# Patient Record
Sex: Female | Born: 1995 | Race: White | Hispanic: No | Marital: Single | Smoking: Current some day smoker
Health system: Southern US, Community
[De-identification: ages and names within clinical notes are randomized; demographics above are authoritative.]

## PROBLEM LIST (undated history)

## (undated) DIAGNOSIS — N189 Chronic kidney disease, unspecified: Secondary | ICD-10-CM

## (undated) DIAGNOSIS — F329 Major depressive disorder, single episode, unspecified: Secondary | ICD-10-CM

## (undated) DIAGNOSIS — K219 Gastro-esophageal reflux disease without esophagitis: Secondary | ICD-10-CM

## (undated) DIAGNOSIS — F32A Depression, unspecified: Secondary | ICD-10-CM

## (undated) DIAGNOSIS — F419 Anxiety disorder, unspecified: Secondary | ICD-10-CM

---
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
## 2014-04-03 DIAGNOSIS — F1010 Alcohol abuse, uncomplicated: Secondary | ICD-10-CM | POA: Insufficient documentation

## 2014-04-03 DIAGNOSIS — Z7289 Other problems related to lifestyle: Secondary | ICD-10-CM | POA: Insufficient documentation

## 2014-04-03 DIAGNOSIS — F1210 Cannabis abuse, uncomplicated: Secondary | ICD-10-CM | POA: Insufficient documentation

## 2014-04-03 DIAGNOSIS — F32A Depression, unspecified: Secondary | ICD-10-CM | POA: Insufficient documentation

## 2014-04-03 DIAGNOSIS — T391X2A Poisoning by 4-Aminophenol derivatives, intentional self-harm, initial encounter: Secondary | ICD-10-CM | POA: Insufficient documentation

## 2014-04-03 DIAGNOSIS — F329 Major depressive disorder, single episode, unspecified: Secondary | ICD-10-CM | POA: Insufficient documentation

## 2014-04-03 DIAGNOSIS — F1320 Sedative, hypnotic or anxiolytic dependence, uncomplicated: Secondary | ICD-10-CM | POA: Insufficient documentation

## 2014-04-03 DIAGNOSIS — F1410 Cocaine abuse, uncomplicated: Secondary | ICD-10-CM | POA: Insufficient documentation

## 2014-04-03 DIAGNOSIS — F6089 Other specific personality disorders: Secondary | ICD-10-CM | POA: Insufficient documentation

## 2014-04-03 DIAGNOSIS — F1310 Sedative, hypnotic or anxiolytic abuse, uncomplicated: Secondary | ICD-10-CM | POA: Insufficient documentation

## 2014-05-05 ENCOUNTER — Emergency Department: Payer: Self-pay | Admitting: Emergency Medicine

## 2015-01-12 NOTE — Discharge Summary (Signed)
PATIENT NAME:  Heather Duncan, Heather Duncan MR#:  [id number] DATE OF BIRTH:  11-17-1995  DATE OF ADMISSION:  11/16/2013 DATE OF DISCHARGE:  11/21/2013  HOSPITAL COURSE: See dictated history and physical for details of admission. An 19 year old woman with a history of depression came into the hospital with serious suicidal ideation. She had overdosed on Tylenol. Fortunately, her blood level did not rise into the severely dangerous level and she did not require full detoxification and did not appear to have done any lasting damage to her liver. The patient gave a history of having been depressed for quite a while with a recent worsening while at school at [employer]. She did not report specific psychotic symptoms, but at times appeared to be quite withdrawn and a little bit disorganized in her thinking. She was cooperative with treatment; however, and did not engage in any dangerous behavior on the unit. She remained somewhat withdrawn at times, but showed improved insight. Met with her family and that appeared to be going well. We counseled the patient that she consider taking at least this semester off from college to recover from this and the patient appeared to agree with this assessment. I gave her a preliminary note suggesting that. She is going to have follow-up care with Dr. Geanie Kenning at [facility] in [place]. She was treated on the ward with fluoxetine 20 mg a day, also low-dose zolpidem and trazodone to assist with sleep. Did not ever show any clear obvious symptoms of psychosis. She was counseled about the importance of following up with treatment and of monitoring symptoms and advising family and care providers of any worsening and agreed to all of this.   DISPOSITION: Discharge home with her family who live locally. Follow up with [facility].   MEDICATIONS: Fluoxetine 20 mg per day, trazodone 50 mg at night as needed for sleep, zolpidem 5 mg at night as needed for sleep.    MENTAL STATUS EXAM AT DISCHARGE: Neatly groomed and dressed woman, looks her stated age, cooperative with the interview. Eye contact good. Psychomotor activity a little bit sluggish still, speech a little bit slow and decreased in amount. Affect blunted but improved from admission. Mood stated as being okay. Thoughts are lucid but a little bit slow. No obvious delusions. No loosening of associations. Denies suicidal or homicidal ideation. Denies any hallucinations. Short and long-term memory both grossly intact. Good fund of knowledge.   LABORATORY RESULTS: Admission labs included urinalysis unremarkable. Acetaminophen negative off. at the time she was admitted on the 28th, sodium elevated at 142, calcium low 8.9. Alcohol negative. CBC normal. TSH normal. Drug screen positive for cocaine and cannabis. Urine pregnancy test negative. EKG normal.   DIAGNOSIS, PRINCIPAL AND PRIMARY:  AXIS I: Major depression, severe, single.   SECONDARY DIAGNOSES: AXIS I: Marijuana and cocaine abuse.  AXIS II: Deferred.  AXIS III: No diagnosis.  AXIS IV: Severe from having to leave school from degree of illness.  AXIS V: Functioning at time of discharge is  55.   ________________________________  Gonzella Lex, MD jtc:sg D: 12/10/2013 23:24:39 ET T: 12/11/2013 10:07:36 ET JOB#: [id number]  cc: Gonzella Lex, MD, <Dictator> Gonzella Lex MD ELECTRONICALLY SIGNED 12/11/2013 12:16

## 2015-01-12 NOTE — H&P (Signed)
PATIENT NAME:  Heather Duncan, Heather Duncan MR#:  [id number] DATE OF BIRTH:  26-Jan-1996  DATE OF ADMISSION:  11/15/2013  IDENTIFYING INFORMATION AND CHIEF COMPLAINT: An 19 year old woman brought to the hospital by her family because of a suicide attempt and depression.   CHIEF COMPLAINT: "I took a lot of Tylenol."   HISTORY OF PRESENT ILLNESS: Information obtained from the patient and the chart. The patient states that last night she was [facility], and she had been drinking. She cannot even estimate how much she had to drink. She says she consumed between 10 and 12 Tylenol tablets, and that at the time she did it, she was actually thinking that she wanted to kill herself. She went to bed without telling anyone, but the next morning woke up and told some friends, and called her mother and told her mother that she wanted to come home from school. A friend made her inform her mother about the overdose as well, and mother brought her here to the hospital. The patient says that she has been feeling depressed for a couple of years. The suicidal ideation has been present for many months. She says she sleeps well at night. She is doing okay in school. She says that she does not feel like she has many friends at [employer]. She is rather vague about describing her stresses, just says that she feels depressed much of the time. She admits that she drinks frequently, 3 or 4 nights a week, but does not see it as being a problem. She also uses marijuana fairly regularly, and has used cocaine several times in the last couple weeks, but minimizes the degree to which that is a problem. She is not currently seeing anyone for any kind of mental health treatment. She does not describe, right now, any specific acute trauma.   PAST PSYCHIATRIC HISTORY: No previous psychiatric hospitalizations. No history of psychiatric medicine. She saw a therapist when she was in high school, but did not think that it was helpful.   SUBSTANCE  ABUSE HISTORY: Says that she drinks a few times a week, but does not think it is a problem. Does not report a history of DTs or seizures. Uses marijuana occasionally, maybe a couple of times a week, and has used cocaine a few times recently, but minimizes the degree to which that is a problem.   FAMILY HISTORY: Positive family history of depression in several members of her family, including a grandmother and several aunts. No known history of suicide attempts.   MEDICAL HISTORY: No significant known ongoing medical problems.   CURRENT MEDICATIONS: Takes Loestrin birth control pills, but denied any other prescribed medicine.   ALLERGIES: No known drug allergies.   REVIEW OF SYSTEMS: Depressed mood. Feels sad a lot of the time. Frequent suicidal ideation. Does not report any psychotic symptoms. Currently not reporting any other significant medical complaints. The rest of the review of systems is negative.   MENTAL STATUS EXAMINATION: An adequately groomed woman who looks her stated age. Passively cooperative with the interview. Eye contact intermittent. Psychomotor activity a little bit slow. Speech is a little bit slow and decreased in total amount. Her thoughts seemed to be a little bit scattered, although it is possible that it could be from anxiety. She did not make any grossly bizarre statements, but does not elaborate answers very well. Denies hallucinations. Had recent suicidal ideation. No homicidal ideation. Judgment and insight appear to have some impairment. Intelligence appears to  be normal, acute fund of knowledge normal, short and long-term memory appear to be grossly intact.   PHYSICAL EXAMINATION: SKIN: The patient has multiple superficial scratches or cuts up and down her left arm. She says that is something she has done intermittently, but had stopped for a long time until here recently. No other acute skin lesions.  HEENT: Pupils equal and reactive. Face symmetric. Oral mucosa  normal.  NECK AND BACK:  Flexible, nontender.  MUSCULOSKELETAL: Full range of motion at extremities. Normal gait.  NEUROLOGIC: Strength and reflexes symmetric and normal throughout. Cranial nerves symmetric and normal.  LUNGS: Clear to auscultation.  HEART: Regular rate and rhythm.  ABDOMEN: Soft, nontender. Normal bowel sounds.   VITAL SIGNS: Currently temperature 98.1, pulse 111, respirations 20, blood pressure 133/79.   LABORATORY RESULTS: Drug screen positive for cocaine and cannabis. TSH normal at 0.9. Alcohol level negative. Sodium is slightly elevated at 142, chloride elevated at 110, both possibly from dehydration. Calcium low at 8.9. No other abnormalities. CBC is all normal. Urinalysis positive for protein, no bacteria, no leukocyte esterase. Tylenol level actually came back as zero, salicylates also zero. Pregnancy test negative.   ASSESSMENT: An 19 year old woman who made an impulsive suicide attempt, but also reports long-standing suicidal ideation and depression. Currently seems to be somewhat slow and reserved in her history. Could all be anxiety or the depression. The patient needs hospitalization because of acute suicidality and ongoing depressive symptoms.   TREATMENT PLAN: Admit to Psychiatry. I have proposed to her that we start fluoxetine for treatment of her depression. She is agreeable to this, 10 mg per day. Engage her in groups and activities on the unit as appropriate, and do daily re-evaluation. Her mother has already called up here, and I asked the patient's permission to speak to her mother. She gave permission, except for anything regarding substance abuse, which is her right.   DIAGNOSIS, PRINCIPAL AND PRIMARY:  AXIS I: Major depression, severe, single episode.   SECONDARY DIAGNOSES: AXIS I: Cannabis, cocaine, and alcohol abuse.  AXIS II: Deferred.  AXIS III: Status post acetaminophen overdose, but with no evident sequelae or need for specific treatment.  AXIS IV:  Moderate stress from college experience, new time away from home.  AXIS V: Functioning at time of evaluation 30.      ____________________________ Audery Amel, MD jtc:mr D: 11/15/2013 18:53:39 ET T: 11/15/2013 19:04:52 ET JOB#: [id number]  cc: Audery Amel, MD, <Dictator> Audery Amel MD ELECTRONICALLY SIGNED 11/16/2013 0:44

## 2015-03-03 ENCOUNTER — Encounter: Payer: Self-pay | Admitting: *Deleted

## 2015-03-03 ENCOUNTER — Observation Stay
Admission: EM | Admit: 2015-03-03 | Discharge: 2015-03-04 | Disposition: A | Discharge status: Home / Self Care | Payer: BLUE CROSS/BLUE SHIELD | Attending: Internal Medicine | Admitting: Internal Medicine

## 2015-03-03 ENCOUNTER — Emergency Department: Payer: BLUE CROSS/BLUE SHIELD

## 2015-03-03 DIAGNOSIS — F329 Major depressive disorder, single episode, unspecified: Secondary | ICD-10-CM | POA: Diagnosis not present

## 2015-03-03 DIAGNOSIS — N8320 Unspecified ovarian cysts: Secondary | ICD-10-CM | POA: Diagnosis not present

## 2015-03-03 DIAGNOSIS — F17210 Nicotine dependence, cigarettes, uncomplicated: Secondary | ICD-10-CM | POA: Diagnosis not present

## 2015-03-03 DIAGNOSIS — D72829 Elevated white blood cell count, unspecified: Secondary | ICD-10-CM | POA: Insufficient documentation

## 2015-03-03 DIAGNOSIS — Z79899 Other long term (current) drug therapy: Secondary | ICD-10-CM | POA: Diagnosis not present

## 2015-03-03 DIAGNOSIS — R197 Diarrhea, unspecified: Secondary | ICD-10-CM

## 2015-03-03 DIAGNOSIS — R1110 Vomiting, unspecified: Secondary | ICD-10-CM

## 2015-03-03 DIAGNOSIS — F1210 Cannabis abuse, uncomplicated: Secondary | ICD-10-CM

## 2015-03-03 DIAGNOSIS — K529 Noninfective gastroenteritis and colitis, unspecified: Secondary | ICD-10-CM | POA: Diagnosis not present

## 2015-03-03 DIAGNOSIS — R1013 Epigastric pain: Secondary | ICD-10-CM

## 2015-03-03 DIAGNOSIS — E876 Hypokalemia: Secondary | ICD-10-CM

## 2015-03-03 HISTORY — DX: Depression, unspecified: F32.A

## 2015-03-03 HISTORY — DX: Major depressive disorder, single episode, unspecified: F32.9

## 2015-03-03 LAB — URINALYSIS COMPLETE WITH MICROSCOPIC (ARMC ONLY)
BILIRUBIN URINE: NEGATIVE
Bacteria, UA: NONE SEEN
GLUCOSE, UA: 150 mg/dL — AB
LEUKOCYTES UA: NEGATIVE
Nitrite: NEGATIVE
PH: 9 — AB (ref 5.0–8.0)
Protein, ur: NEGATIVE mg/dL
Specific Gravity, Urine: 1.017 (ref 1.005–1.030)

## 2015-03-03 LAB — CBC WITH DIFFERENTIAL/PLATELET
Basophils Absolute: 0.1 10*3/uL (ref 0–0.1)
Basophils Relative: 0 %
Eosinophils Absolute: 0.1 10*3/uL (ref 0–0.7)
Eosinophils Relative: 0 %
HEMATOCRIT: 35.6 % (ref 35.0–47.0)
HEMOGLOBIN: 11.7 g/dL — AB (ref 12.0–16.0)
Lymphocytes Relative: 13 %
Lymphs Abs: 2.2 10*3/uL (ref 1.0–3.6)
MCH: 28.9 pg (ref 26.0–34.0)
MCHC: 32.9 g/dL (ref 32.0–36.0)
MCV: 87.8 fL (ref 80.0–100.0)
MONO ABS: 0.9 10*3/uL (ref 0.2–0.9)
Monocytes Relative: 5 %
NEUTROS ABS: 14.4 10*3/uL — AB (ref 1.4–6.5)
Neutrophils Relative %: 82 %
Platelets: 289 10*3/uL (ref 150–440)
RBC: 4.05 MIL/uL (ref 3.80–5.20)
RDW: 13.3 % (ref 11.5–14.5)
WBC: 17.7 10*3/uL — ABNORMAL HIGH (ref 3.6–11.0)

## 2015-03-03 LAB — URINE DRUG SCREEN, QUALITATIVE (ARMC ONLY)
Amphetamines, Ur Screen: NOT DETECTED
BARBITURATES, UR SCREEN: NOT DETECTED
Benzodiazepine, Ur Scrn: NOT DETECTED
CANNABINOID 50 NG, UR ~~LOC~~: POSITIVE — AB
Cocaine Metabolite,Ur ~~LOC~~: NOT DETECTED
MDMA (Ecstasy)Ur Screen: NOT DETECTED
METHADONE SCREEN, URINE: NOT DETECTED
Opiate, Ur Screen: POSITIVE — AB
Phencyclidine (PCP) Ur S: NOT DETECTED
Tricyclic, Ur Screen: NOT DETECTED

## 2015-03-03 LAB — COMPREHENSIVE METABOLIC PANEL
ALBUMIN: 4 g/dL (ref 3.5–5.0)
ALT: 17 U/L (ref 14–54)
ANION GAP: 12 (ref 5–15)
AST: 35 U/L (ref 15–41)
Alkaline Phosphatase: 69 U/L (ref 38–126)
BILIRUBIN TOTAL: 0.5 mg/dL (ref 0.3–1.2)
BUN: 9 mg/dL (ref 6–20)
CALCIUM: 8.8 mg/dL — AB (ref 8.9–10.3)
CO2: 18 mmol/L — ABNORMAL LOW (ref 22–32)
Chloride: 109 mmol/L (ref 101–111)
Creatinine, Ser: 0.9 mg/dL (ref 0.44–1.00)
GFR calc non Af Amer: >60 mL/min (ref >60)
GLUCOSE: 196 mg/dL — AB (ref 65–99)
Potassium: 2.9 mmol/L — CL (ref 3.5–5.1)
Sodium: 139 mmol/L (ref 135–145)
TOTAL PROTEIN: 6.9 g/dL (ref 6.5–8.1)

## 2015-03-03 LAB — PREGNANCY, URINE: Preg Test, Ur: NEGATIVE

## 2015-03-03 LAB — MAGNESIUM: MAGNESIUM: 1.5 mg/dL — AB (ref 1.7–2.4)

## 2015-03-03 LAB — LIPASE, BLOOD: Lipase: 27 U/L (ref 22–51)

## 2015-03-03 MED ORDER — METOCLOPRAMIDE HCL 5 MG/ML IJ SOLN
5.0000 mg | Freq: Once | INTRAMUSCULAR | Status: AC
  Administered 2015-03-03: 5 mg via INTRAVENOUS

## 2015-03-03 MED ORDER — ONDANSETRON HCL 4 MG PO TABS: 4.0000 mg | ORAL_TABLET | Freq: Every day | ORAL | Status: DC | PRN

## 2015-03-03 MED ORDER — DIAZEPAM 5 MG/ML IJ SOLN
1.0000 mg | Freq: Once | INTRAMUSCULAR | Status: AC
  Administered 2015-03-03: 1 mg via INTRAVENOUS

## 2015-03-03 MED ORDER — SODIUM CHLORIDE 0.45 % IV SOLN
Freq: Once | INTRAVENOUS | Status: AC
  Administered 2015-03-03: 05:00:00 via INTRAVENOUS
  Filled 2015-03-03: qty 20

## 2015-03-03 MED ORDER — IOHEXOL 300 MG/ML  SOLN
100.0000 mL | Freq: Once | INTRAMUSCULAR | Status: AC | PRN
  Administered 2015-03-03: 100 mL via INTRAVENOUS

## 2015-03-03 MED ORDER — HYDROMORPHONE HCL 1 MG/ML IJ SOLN
INTRAMUSCULAR | Status: AC
  Administered 2015-03-03: 0.5 mg via INTRAVENOUS
  Filled 2015-03-03: qty 1

## 2015-03-03 MED ORDER — DIAZEPAM 5 MG/ML IJ SOLN: 1.0000 mg | Freq: Once | INTRAMUSCULAR | Status: DC

## 2015-03-03 MED ORDER — POTASSIUM CHLORIDE IN NACL 40-0.9 MEQ/L-% IV SOLN
INTRAVENOUS | Status: DC
  Administered 2015-03-03 – 2015-03-04 (×2): 100 mL/h via INTRAVENOUS
  Filled 2015-03-03 (×6): qty 1000

## 2015-03-03 MED ORDER — MORPHINE SULFATE 4 MG/ML IJ SOLN
4.0000 mg | Freq: Once | INTRAMUSCULAR | Status: AC
  Administered 2015-03-03: 4 mg via INTRAVENOUS

## 2015-03-03 MED ORDER — PROMETHAZINE HCL 25 MG/ML IJ SOLN
25.0000 mg | Freq: Three times a day (TID) | INTRAMUSCULAR | Status: DC | PRN
  Administered 2015-03-03: 25 mg via INTRAVENOUS

## 2015-03-03 MED ORDER — HEPARIN SODIUM (PORCINE) 5000 UNIT/ML IJ SOLN
5000.0000 [IU] | Freq: Three times a day (TID) | INTRAMUSCULAR | Status: DC
  Administered 2015-03-03 – 2015-03-04 (×2): 5000 [IU] via SUBCUTANEOUS
  Filled 2015-03-03 (×2): qty 1

## 2015-03-03 MED ORDER — SODIUM CHLORIDE 0.9 % IV BOLUS (SEPSIS)
1000.0000 mL | Freq: Once | INTRAVENOUS | Status: AC
  Administered 2015-03-03: 1000 mL via INTRAVENOUS

## 2015-03-03 MED ORDER — DIAZEPAM 5 MG/ML IJ SOLN
INTRAMUSCULAR | Status: AC
  Administered 2015-03-03: 1 mg via INTRAVENOUS
  Filled 2015-03-03: qty 2

## 2015-03-03 MED ORDER — IOHEXOL 240 MG/ML SOLN
25.0000 mL | Freq: Once | INTRAMUSCULAR | Status: AC | PRN
  Administered 2015-03-03: 25 mL via ORAL

## 2015-03-03 MED ORDER — METOCLOPRAMIDE HCL 5 MG/ML IJ SOLN
INTRAMUSCULAR | Status: AC
  Administered 2015-03-03: 5 mg via INTRAVENOUS
  Filled 2015-03-03: qty 2

## 2015-03-03 MED ORDER — OXYCODONE HCL 5 MG PO TABS
5.0000 mg | ORAL_TABLET | ORAL | Status: DC | PRN
  Administered 2015-03-04: 5 mg via ORAL
  Filled 2015-03-03: qty 1

## 2015-03-03 MED ORDER — POTASSIUM CHLORIDE IN NACL 40-0.9 MEQ/L-% IV SOLN
INTRAVENOUS | Status: AC
  Administered 2015-03-03: 100 mL/h via INTRAVENOUS
  Filled 2015-03-03: qty 1000

## 2015-03-03 MED ORDER — MORPHINE SULFATE 2 MG/ML IJ SOLN
INTRAMUSCULAR | Status: AC
  Administered 2015-03-03: 1 mg via INTRAVENOUS
  Filled 2015-03-03: qty 1

## 2015-03-03 MED ORDER — MORPHINE SULFATE 4 MG/ML IJ SOLN
INTRAMUSCULAR | Status: AC
Start: 2015-03-03 — End: 2015-03-03
  Administered 2015-03-03: 4 mg via INTRAVENOUS
  Filled 2015-03-03: qty 1

## 2015-03-03 MED ORDER — ONDANSETRON HCL 4 MG/2ML IJ SOLN
INTRAMUSCULAR | Status: AC
  Administered 2015-03-03: 4 mg via INTRAVENOUS
  Filled 2015-03-03: qty 2

## 2015-03-03 MED ORDER — PROMETHAZINE HCL 25 MG/ML IJ SOLN
12.5000 mg | Freq: Once | INTRAMUSCULAR | Status: AC
  Administered 2015-03-03: 12.5 mg via INTRAVENOUS

## 2015-03-03 MED ORDER — MORPHINE SULFATE 2 MG/ML IJ SOLN
1.0000 mg | Freq: Four times a day (QID) | INTRAMUSCULAR | Status: DC | PRN
  Administered 2015-03-03 (×2): 1 mg via INTRAVENOUS
  Filled 2015-03-03: qty 1

## 2015-03-03 MED ORDER — ONDANSETRON HCL 4 MG/2ML IJ SOLN
4.0000 mg | Freq: Four times a day (QID) | INTRAMUSCULAR | Status: DC | PRN
  Administered 2015-03-03: 4 mg via INTRAVENOUS
  Filled 2015-03-03: qty 2

## 2015-03-03 MED ORDER — DULOXETINE HCL 60 MG PO CPEP
60.0000 mg | ORAL_CAPSULE | Freq: Every day | ORAL | Status: DC
  Administered 2015-03-03 – 2015-03-04 (×2): 60 mg via ORAL
  Filled 2015-03-03 (×2): qty 1

## 2015-03-03 MED ORDER — HYDROMORPHONE HCL 1 MG/ML IJ SOLN
0.5000 mg | Freq: Once | INTRAMUSCULAR | Status: AC
  Administered 2015-03-03: 0.5 mg via INTRAVENOUS

## 2015-03-03 MED ORDER — PROMETHAZINE HCL 25 MG/ML IJ SOLN
INTRAMUSCULAR | Status: AC
  Administered 2015-03-03: 12.5 mg via INTRAVENOUS
  Filled 2015-03-03: qty 1

## 2015-03-03 MED ORDER — PROMETHAZINE HCL 25 MG/ML IJ SOLN
INTRAMUSCULAR | Status: AC
  Administered 2015-03-03: 25 mg via INTRAVENOUS
  Filled 2015-03-03: qty 1

## 2015-03-03 MED ORDER — ACETAMINOPHEN 650 MG RE SUPP: 650.0000 mg | Freq: Four times a day (QID) | RECTAL | Status: DC | PRN

## 2015-03-03 MED ORDER — ONDANSETRON HCL 4 MG/2ML IJ SOLN
4.0000 mg | Freq: Once | INTRAMUSCULAR | Status: AC
  Administered 2015-03-03: 4 mg via INTRAVENOUS

## 2015-03-03 MED ORDER — ACETAMINOPHEN 325 MG PO TABS: 650.0000 mg | ORAL_TABLET | Freq: Four times a day (QID) | ORAL | Status: DC | PRN

## 2015-03-03 MED ORDER — ONDANSETRON HCL 4 MG PO TABS: 4.0000 mg | ORAL_TABLET | Freq: Four times a day (QID) | ORAL | Status: DC | PRN

## 2015-03-03 NOTE — ED Provider Notes (Signed)
[facility] Emergency Department Provider Note  ____________________________________________  Time seen: Approximately 3:48 AM  I have reviewed the triage vital signs and the nursing notes.   HISTORY  Chief Complaint Abdominal Pain    HPI Heather Duncan is a 19 y.o. female who presents with epigastric abdominal pain, onset approximately 10 PM after eating [employer] and smoking marijuana. Patient complains of 10/10 nonradiating pain associated with nausea, vomiting and diarrhea.Denies fever, chills, chest pain, shortness of breath, headache, weakness, numbness, tingling. Denies recent travel history. Nothing makes the pain better or worse.   Past Medical History  Diagnosis Date  . Depression     There are no active problems to display for this patient.   History reviewed. No pertinent past surgical history.  Current Outpatient Rx  Name  Route  Sig  Dispense  Refill  . DULoxetine (CYMBALTA) 60 MG capsule   Oral   Take 60 mg by mouth daily.           Allergies Review of patient's allergies indicates no known allergies.  History reviewed. No pertinent family history.  Social History History  Substance Use Topics  . Smoking status: Current Some Day Smoker    Types: Cigarettes  . Smokeless tobacco: Never Used  . Alcohol Use: Yes     Comment: occasionally    Review of Systems Constitutional: No fever/chills Eyes: No visual changes. ENT: No sore throat. Cardiovascular: Denies chest pain. Respiratory: Denies shortness of breath. Gastrointestinal: Positive for abdominal pain, vomiting and diarrhea. Genitourinary: Negative for dysuria. Musculoskeletal: Negative for back pain. Skin: Negative for rash. Neurological: Negative for headaches, focal weakness or numbness.  10-point ROS otherwise negative.  ____________________________________________   PHYSICAL EXAM:  VITAL SIGNS: ED Triage Vitals  Enc Vitals Group     BP 03/03/15 0304  131/76 mmHg     Pulse Rate 03/03/15 0304 63     Resp 03/03/15 0304 24     Temp 03/03/15 0304 97.6 F (36.4 C)     Temp Source 03/03/15 0304 Oral     SpO2 03/03/15 0304 100 %     Weight 03/03/15 0304 140 lb (63.504 kg)     Height 03/03/15 0304  (1.651 m)     Head Cir --      Peak Flow --      Pain Score 03/03/15 0307 10     Pain Loc --      Pain Edu? --      Excl. in GC? --     Constitutional: Alert and oriented. Anxious, hyperventilating, moaning in moderate distress. Eyes: Conjunctivae are normal. PERRL. EOMI. Head: Atraumatic. Nose: No congestion/rhinnorhea. Mouth/Throat: Mucous membranes are moist.  Oropharynx non-erythematous. Neck: No stridor.   Cardiovascular: Normal rate, regular rhythm. Grossly normal heart sounds.  Good peripheral circulation. Respiratory: Normal respiratory effort.  No retractions. Lungs CTAB. Gastrointestinal: Soft, diffusely tender to palpation. Difficult exam as patient is moving about in the bed. No distention. No abdominal bruits. No CVA tenderness. Musculoskeletal: No lower extremity tenderness nor edema.  No joint effusions. Neurologic:  Normal speech and language. No gross focal neurologic deficits are appreciated. Speech is normal. Skin:  Skin is warm, dry and intact. No rash noted. Psychiatric: Mood and affect are normal. Speech and behavior are normal.  ____________________________________________   LABS (all labs ordered are listed, but only abnormal results are displayed)  Labs Reviewed  CBC WITH DIFFERENTIAL/PLATELET - Abnormal; Notable for the following:    WBC 17.7 (*)  Hemoglobin 11.7 (*)    Neutro Abs 14.4 (*)    All other components within normal limits  COMPREHENSIVE METABOLIC PANEL - Abnormal; Notable for the following:    Potassium 2.9 (*)    CO2 18 (*)    Glucose, Bld 196 (*)    Calcium 8.8 (*)    All other components within normal limits  URINALYSIS COMPLETEWITH MICROSCOPIC ([facility] ONLY) - Abnormal; Notable for  the following:    Color, Urine YELLOW (*)    APPearance CLEAR (*)    Glucose, UA 150 (*)    Ketones, ur 2+ (*)    Hgb urine dipstick 3+ (*)    pH 9.0 (*)    Squamous Epithelial / LPF 0-5 (*)    All other components within normal limits  URINE DRUG SCREEN, QUALITATIVE ([facility] ONLY) - Abnormal; Notable for the following:    Opiate, Ur Screen POSITIVE (*)    Cannabinoid 50 Ng, Ur [place] POSITIVE (*)    All other components within normal limits  PREGNANCY, URINE  LIPASE, BLOOD   ____________________________________________  EKG  None ____________________________________________  RADIOLOGY  CT abdomen/pelvis pending at this time. ____________________________________________   PROCEDURES  Procedure(s) performed: None  Critical Care performed: No  ____________________________________________   INITIAL IMPRESSION / ASSESSMENT AND PLAN / ED COURSE  Pertinent labs & imaging results that were available during my care of the patient were reviewed by me and considered in my medical decision making (see chart for details).  20 year old female who presents with abdominal pain, vomiting, diarrhea. Will start IV fluids, IV analgesia, reexamination abdomen once patient is more comfortable.  ----------------------------------------- 4:37 AM on 03/03/2015 -----------------------------------------  Reexamined abdomen which is diffusely tender to palpation. Pain has returned. Patient received IV Valium and now receiving Phenergan for persistent nausea. Will attempt to obtain CT abdomen/pelvis with PO contrast. Labs notable for leukocytosis and hypokalemia. 1/2NS with KCl ordered.  ----------------------------------------- 7:36 AM on 03/03/2015 -----------------------------------------  Patient requesting additional antiemetic. No active vomiting noted. Will order IV Reglan. Care transferred to Dr. Cyril Loosen; CT abdomen/pelvis  pending. ____________________________________________   FINAL CLINICAL IMPRESSION(S) / ED DIAGNOSES  Final diagnoses:  Epigastric pain  Vomiting and diarrhea  Hypokalemia  Marijuana abuse      Irean Hong, MD 03/03/15 [phone]

## 2015-03-03 NOTE — ED Notes (Signed)
Reviewed discharge instructions with patient and removed IV. Patient remains pale and fidgety in bed. ED MD stepped in to see patient. ED MD decided to cancel discharge status. Patient agreeable with plan for admission.

## 2015-03-03 NOTE — ED Notes (Addendum)
Pt is extremely anxious, pale, thrashing in bed, hyperventilating. Pt states she smoked weed tonight and ate at [employer] and began having n/v/d and generalized abd pain. Pt groaning loudly and speaking uncomprehensively. Pt states she cuts herself, lacs noted to left upper arm. No bleeding.

## 2015-03-03 NOTE — ED Notes (Signed)
MD aware of K level.

## 2015-03-03 NOTE — ED Provider Notes (Addendum)
CT scan reviewed. Discussed results with patient. She reports feeling significant better. Given her nausea vomiting diarrhea and abdominal cramping suspect viral gastroenteritis. She is tolerating by mouth's at this time. We will discharge her with nausea medication with strict return precautions  Jene Every, MD 03/03/15 [id number]  Jene Every, MD 03/03/15 [id number]  ----------------------------------------- 9:31 AM on 03/03/2015 -----------------------------------------  Nurse about to discharge patient who is now vomiting again and complaining of significant abdominal cramping. I will admit the patient.  Jene Every, MD 03/03/15 [phone]

## 2015-03-03 NOTE — Discharge Instructions (Signed)

## 2015-03-03 NOTE — ED Notes (Signed)
Patient transported to CT 

## 2015-03-03 NOTE — ED Notes (Signed)
Pt encouraged to drink CT contrast. Pt laying in fetal position moaning and shaking her legs. MD aware.

## 2015-03-03 NOTE — ED Notes (Signed)
Pt continues to thrash in the bed. Pt continues to kick off her blankets and pull up her gown so her bottom is exposed. Pt in NAD, moaning and groaning loudly as she thrashes around.

## 2015-03-03 NOTE — ED Notes (Signed)
Patient fidgety in bed. Patient pale with skin warm and dry to touch. Encouraged patient to drink as much CT contrast as she can tolerate. Call bell within reach. Encouraged to call with needs. Will continue to monitor.

## 2015-03-03 NOTE — ED Notes (Signed)
Awaiting fluids from pharmacy 

## 2015-03-03 NOTE — H&P (Addendum)
[facility] - [place] at [facility]   PATIENT NAME: Heather Duncan    MR#:  [id number]  DATE OF BIRTH:  27-Mar-1996  DATE OF ADMISSION:  03/03/2015  PRIMARY CARE PHYSICIAN: No primary care provider on file.   REQUESTING/REFERRING PHYSICIAN: Dr Cyril Loosen  CHIEF COMPLAINT:  Nausea vomiting and abdominal cramping since last night 10:00  HISTORY OF PRESENT ILLNESS:  Thi Blee  is a 19 y.o. female with a known history of depression comes to the emergency room after she started having nausea and vomiting and abdominal cramping around 10:00 last night after she had eaten from  [employer]. In the emergency room patient received a couple rounds ANTI-EMETICS IV MORPHINE RECEIVED IV FLUIDS. SHE WAS DOING WELL ABLE TO TOLERATE LIQUIDS AND WAS ABOUT TO BE DISCHARGED HOME WHERE SHE STARTED HAVING SIGNIFICANT ABDOMINAL CRAMPING AND DRY HEAVING. INTERNAL MEDICINE WAS CONSULTED FOR ADMISSION.  PAST MEDICAL HISTORY:   Past Medical History  Diagnosis Date  . Depression     PAST SURGICAL HISTOIRY:  History reviewed. No pertinent past surgical history.  SOCIAL HISTORY:   History  Substance Use Topics  . Smoking status: Current Some Day Smoker    Types: Cigarettes  . Smokeless tobacco: Never Used  . Alcohol Use: Yes     Comment: occasionally    FAMILY HISTORY:  HTN  DRUG ALLERGIES:  No Known Allergies  REVIEW OF SYSTEMS:  Review of Systems  Constitutional: Negative for fever, chills and diaphoresis.  HENT: Negative for congestion, ear pain, hearing loss, nosebleeds and sore throat.   Eyes: Negative for blurred vision, double vision, photophobia and pain.  Respiratory: Negative for hemoptysis, sputum production, wheezing and stridor.   Cardiovascular: Negative for orthopnea, claudication and leg swelling.  Gastrointestinal: Positive for nausea, vomiting and abdominal pain. Negative for heartburn and diarrhea.  Genitourinary: Negative for dysuria and frequency.   Musculoskeletal: Negative for back pain, joint pain and neck pain.  Skin: Negative for rash.  Neurological: Positive for weakness. Negative for tingling, sensory change, speech change, focal weakness, seizures and headaches.  Endo/Heme/Allergies: Does not bruise/bleed easily.  Psychiatric/Behavioral: Positive for substance abuse. Negative for suicidal ideas and memory loss. The patient is not nervous/anxious.      MEDICATIONS AT HOME:   Prior to Admission medications   Medication Sig Start Date End Date Taking? Authorizing Provider  DULoxetine (CYMBALTA) 60 MG capsule Take 60 mg by mouth daily.   Yes Historical Provider, MD  lamoTRIgine (LAMICTAL) 100 MG tablet Take 100 mg by mouth daily. 02/22/15  Yes Historical Provider, MD  LORazepam (ATIVAN) 0.5 MG tablet Take 0.5 mg by mouth daily. 02/22/15  Yes Historical Provider, MD  traZODone (DESYREL) 100 MG tablet Take 100 mg by mouth daily as needed. 02/22/15  Yes Historical Provider, MD  ondansetron (ZOFRAN) 4 MG tablet Take 1 tablet (4 mg total) by mouth daily as needed for nausea or vomiting. 03/03/15   Jene Every, MD      VITAL SIGNS:  Blood pressure 99/58, pulse 83, temperature 97.6 F (36.4 C), temperature source Oral, resp. rate 22, height 5\' 5"  (1.651 m), weight 63.504 kg (140 lb), last menstrual period 02/27/2015, SpO2 100 %.  PHYSICAL EXAMINATION:  GENERAL:  19 y.o.-year-old patient lying in the bed with   moderate acute distress.  EYES: Pupils equal, round, reactive to light and accommodation. No scleral icterus. Extraocular muscles intact.  HEENT: Head atraumatic, normocephalic. Oropharynx and nasopharynx clear.  NECK:  Supple, no jugular venous distention. No thyroid  enlargement, no tenderness.  LUNGS: Normal breath sounds bilaterally, no wheezing, rales,rhonchi or crepitation. No use of accessory muscles of respiration.  CARDIOVASCULAR: S1, S2 normal. No murmurs, rubs, or gallops.  ABDOMEN: Soft, mild tender generalized,  nondistended. Bowel sounds present. No organomegaly or mass.  EXTREMITIES: No pedal edema, cyanosis, or clubbing.  NEUROLOGIC: Cranial nerves II through XII are intact. Muscle strength 5/5 in all extremities. Sensation intact. Gait not checked.  PSYCHIATRIC:  patient is alert and oriented x 3. Hyperventilating,anxious SKIN: No obvious rash, lesion, or ulcer.   LABORATORY PANEL:   CBC  Recent Labs Lab 03/03/15 0334  WBC 17.7*  HGB 11.7*  HCT 35.6  PLT 289   ------------------------------------------------------------------------------------------------------------------  Chemistries   Recent Labs Lab 03/03/15 0334  NA 139  K 2.9*  CL 109  CO2 18*  GLUCOSE 196*  BUN 9  CREATININE 0.90  CALCIUM 8.8*  MG 1.5*  AST 35  ALT 17  ALKPHOS 69  BILITOT 0.5   RADIOLOGY:  Ct Abdomen Pelvis W Contrast  03/03/2015   CLINICAL DATA:  19 year old female with abdominal and pelvic pain beginning last night at 10 p.m.  EXAM: CT ABDOMEN AND PELVIS WITH CONTRAST  TECHNIQUE: Multidetector CT imaging of the abdomen and pelvis was performed using the standard protocol following bolus administration of intravenous contrast.  CONTRAST:  OMNIPAQUE IOHEXOL 300 MG/ML  SOLN  COMPARISON:  Acute abdominal series 11 11/10/2012  FINDINGS: Lower Chest: The lung bases are clear. Visualized cardiac structures are within normal limits for size. No pericardial effusion. Unremarkable visualized distal thoracic esophagus.  Abdomen: Unremarkable CT appearance of the stomach, duodenum, spleen, adrenal glands and pancreas. Normal hepatic contour and morphology. No discrete hepatic lesion. Gallbladder is unremarkable. No intra or extrahepatic biliary ductal dilatation.  Unremarkable appearance of the bilateral kidneys. No focal solid lesion, hydronephrosis or nephrolithiasis. No evidence of obstruction or focal bowel wall thickening. Normal appendix in the right lower quadrant. The terminal ileum is unremarkable. No  free fluid or suspicious adenopathy.  Pelvis: Unremarkable bladder, uterus and adnexa. Trace free fluid is likely physiologic. No suspicious adenopathy. 2.9 simple cyst incidentally noted within the right ovary. This is a normal finding.  Bones/Soft Tissues: No acute fracture or aggressive appearing lytic or blastic osseous lesion.  Vascular: No significant atherosclerotic vascular disease, aneurysmal dilatation or acute abnormality.  IMPRESSION: 1. No acute abnormality in the abdomen or pelvis to explain the patient's clinical symptoms. 2. Incidental note is made of a 2.9 cm simple right ovarian cyst. Although a normal finding in a reproductive age female, this could be a source of pain if the patient's pain localizes to the right lower quadrant/right pelvis.   Electronically Signed   By: Malachy Moan M.D.   On: 03/03/2015 08:41   IMPRESSION AND PLAN:   19 year old female with past medical history of depression comes into the emergency room with  #1 intractable nausea vomiting and abdominal cramping Suspect this could be due to possible food poisoning versus viral gastroenteritis. Admitted to medical floor for overnight observation. IV fluids IV Phenergan and IV Zofran as needed IV morphine as needed Ice chips  #2 incidental note of a right ovarian cyst 2.9 cm. Patient's pain is generalized at present. We'll get a GYN evaluation if needed for now continue to monitor.  #3 history of depression continue Cymbalta  #4 Substance abuse -UDS positive for marijuana(pt took last nite) and opiates(recved moprhine in ER)  #5 leukocytosis appears reactive No source of infection identified.  We'll continue to monitor CBC count  #6 hypokalemia Suspected due to vomiting. Continue IV fluids with IV KCl.  All the records are reviewed and case discussed with ED provider. Management plans discussed with the patient, friend and they are in agreement. -parents are aware of pt being admitted  CODE  STATUS: FULL Code  TOTAL TIME TAKING CARE OF THIS PATIENT:48minutes.    Will Heinkel M.D on 03/03/2015 at 10:07 AM  Between 7am to 6pm - Pager - [phone]  After 6pm go to [email or web address] - password [email or web address] [facility]  [place] [facility]  Office  [phone]  CC: Primary care physician; No primary care provider on file.

## 2015-03-03 NOTE — ED Notes (Signed)
RN called lab to add on lipase. 

## 2015-03-03 NOTE — ED Notes (Addendum)
Pt presents w/ c/o epigastric abdominal pain after eating [employer] and smoking marijuana. Pt is extremely agitated in triage. Pt is pale and hyperventilating. Pt states vomiting too many times to count.

## 2015-03-03 NOTE — ED Notes (Signed)
Lab notified of add on test.   

## 2015-03-03 NOTE — ED Notes (Signed)
Pt continues to thrash around stretcher, kicking her legs around in bed, putting her legs through the rails. Pt directed to stay in bed for safety. Pt kicking legs with eyes closed, opens eyes looks at RN and closes her eyes as she moans loudly and thrashes. Pt dumped out contrast on sheets. Sheets changed.

## 2015-03-04 LAB — BASIC METABOLIC PANEL
Anion gap: 4 — ABNORMAL LOW (ref 5–15)
BUN: 9 mg/dL (ref 6–20)
CO2: 24 mmol/L (ref 22–32)
Calcium: 8.6 mg/dL — ABNORMAL LOW (ref 8.9–10.3)
Chloride: 113 mmol/L — ABNORMAL HIGH (ref 101–111)
Creatinine, Ser: 0.88 mg/dL (ref 0.44–1.00)
GLUCOSE: 91 mg/dL (ref 65–99)
Potassium: 4 mmol/L (ref 3.5–5.1)
Sodium: 141 mmol/L (ref 135–145)

## 2015-03-04 LAB — CBC
HCT: 33.5 % — ABNORMAL LOW (ref 35.0–47.0)
HEMOGLOBIN: 11.1 g/dL — AB (ref 12.0–16.0)
MCH: 29.6 pg (ref 26.0–34.0)
MCHC: 33.2 g/dL (ref 32.0–36.0)
MCV: 89 fL (ref 80.0–100.0)
Platelets: 177 10*3/uL (ref 150–440)
RBC: 3.76 MIL/uL — ABNORMAL LOW (ref 3.80–5.20)
RDW: 13.3 % (ref 11.5–14.5)
WBC: 7.8 10*3/uL (ref 3.6–11.0)

## 2015-03-04 NOTE — Discharge Summary (Signed)
[facility] - [place] at [facility]   PATIENT NAME: Heather Duncan    MR#:  [id number]  DATE OF BIRTH:  08-26-1996  DATE OF ADMISSION:  03/03/2015 ADMITTING PHYSICIAN: Enedina Finner, MD  DATE OF DISCHARGE: 03/04/15  PRIMARY CARE PHYSICIAN: No primary care provider on file.    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 Depression     HOSPITAL COURSE:  19 year old female with past medical history of depression comes into the emergency room with  #1 intractable nausea vomiting and abdominal cramping Suspect this could be due to possible food poisoning  Resolved now. Able to tolerate [po diet recvd IV fluids,IV Phenergan and IV Zofran as needed  #2 incidental note of a right ovarian cyst 2.9 cm. Patient's pain is generalized at present.  -pt informed of this  #3 history of depression continue Cymbalta  #4 Substance abuse -UDS positive for marijuana(pt took last nite) and opiates(recved moprhine in ER)  #5 leukocytosis appears reactive No source of infection identified.  -wbc wnl today  #6 hypokalemia Suspected due to vomiting.  -recved IV KC  Overall much improved. Will d/c too go home  DISCHARGE CONDITIONS:   fair  CONSULTS OBTAINED:    none DRUG ALLERGIES:  No Known Allergies  DISCHARGE MEDICATIONS:   Current Discharge Medication List    START taking these medications   Details  ondansetron (ZOFRAN) 4 MG tablet Take 1 tablet (4 mg total) by mouth daily as needed for nausea or vomiting. Qty: 20 tablet, Refills: 1      CONTINUE these medications which have NOT CHANGED   Details  DULoxetine (CYMBALTA) 60 MG capsule Take 60 mg by mouth daily.    lamoTRIgine (LAMICTAL) 100 MG tablet Take 100 mg by mouth daily. Refills: 3    LORazepam (ATIVAN) 0.5 MG tablet Take 0.5 mg by mouth daily. Refills: 2    traZODone (DESYREL) 100 MG tablet Take 100 mg by mouth daily as needed. Refills: 3       If you experience worsening of your admission symptoms, develop shortness of breath, life threatening emergency, suicidal or homicidal thoughts you must seek medical attention immediately by calling 911 or calling your MD immediately  if symptoms less severe.  You Must read complete instructions/literature along with all the possible adverse reactions/side effects for all the Medicines you take and that have been prescribed to you. Take any new Medicines after you have completely understood and accept all the possible adverse reactions/side effects.   Please note  You were cared for by a hospitalist during your hospital stay. If you have any questions about your discharge medications or the care you received while you were in the hospital after you are discharged, you can call the unit and asked to speak with the hospitalist on call if the hospitalist that took care of you is not available. Once you are discharged, your primary care physician will handle any further medical issues. Please note that NO REFILLS for any discharge medications will be authorized once you are discharged, as it is imperative that you return to your primary care physician (or establish a relationship with a primary care physician if you do not have one) for your aftercare needs so that they can reassess your need for medications and monitor your lab values. Today   SUBJECTIVE  Doing well. No nausea or abd pain  VITAL SIGNS:  Blood pressure 101/50, pulse 50, temperature 98.1 F (36.7 C), temperature source Oral, resp. rate 18, height 5\' 5"  (1.651 m), weight 63.504 kg (140 lb), last menstrual period 02/27/2015, SpO2 100 %.  I/O:   Intake/Output Summary (Last 24 hours) at 03/04/15 1053 Last data filed at 03/04/15 0745  Gross per 24 hour  Intake    1600 ml  Output    750 ml  Net    850 ml    PHYSICAL EXAMINATION:  GENERAL:  19 y.o.-year-old patient lying in the bed with no acute distress.  EYES: Pupils equal, round, reactive to light and accommodation. No scleral icterus. Extraocular muscles intact.  HEENT: Head atraumatic, normocephalic. Oropharynx and nasopharynx clear.  NECK:  Supple, no jugular venous distention. No thyroid enlargement, no tenderness.  LUNGS: Normal breath sounds bilaterally, no wheezing, rales,rhonchi or crepitation. No use of accessory muscles of respiration.  CARDIOVASCULAR: S1, S2 normal. No murmurs, rubs, or gallops.  ABDOMEN: Soft, non-tender, non-distended. Bowel sounds present. No organomegaly or mass.  EXTREMITIES: No pedal edema, cyanosis, or clubbing.  NEUROLOGIC: Cranial nerves II through XII are intact. Muscle strength 5/5 in all extremities. Sensation intact. Gait not checked.  PSYCHIATRIC: The patient is alert and oriented x 3.  SKIN: No obvious rash, lesion, or ulcer.   DATA REVIEW:   CBC   Recent Labs Lab 03/04/15 0455  WBC 7.8  HGB 11.1*  HCT 33.5*  PLT 177    Chemistries   Recent Labs Lab 03/03/15 0334 03/04/15 0455  NA 139 141  K 2.9* 4.0  CL 109 113*  CO2 18* 24  GLUCOSE 196* 91  BUN 9 9  CREATININE 0.90 0.88  CALCIUM 8.8* 8.6*  MG 1.5*  --   AST 35  --   ALT 17  --   ALKPHOS 69  --   BILITOT 0.5  --     Microbiology Results   No results found for this or any previous visit (from the past 240 hour(s)).  RADIOLOGY:  Ct Abdomen Pelvis W Contrast  03/03/2015   CLINICAL DATA:  19 year old female with abdominal and pelvic pain beginning last night at 10 p.m.  EXAM: CT ABDOMEN AND PELVIS WITH CONTRAST  TECHNIQUE: Multidetector CT imaging of the abdomen and pelvis was performed using the standard protocol following bolus administration of intravenous contrast.  CONTRAST:  OMNIPAQUE IOHEXOL 300 MG/ML  SOLN  COMPARISON:  Acute abdominal series 11 11/10/2012   FINDINGS: Lower Chest: The lung bases are clear. Visualized cardiac structures are within normal limits for size. No pericardial effusion. Unremarkable visualized distal thoracic esophagus.  Abdomen: Unremarkable CT appearance of the stomach, duodenum, spleen, adrenal glands and pancreas. Normal hepatic contour and morphology. No discrete hepatic lesion. Gallbladder is unremarkable. No intra or extrahepatic biliary ductal dilatation.  Unremarkable appearance of the bilateral kidneys. No focal solid lesion, hydronephrosis or nephrolithiasis. No evidence of obstruction or focal bowel wall thickening. Normal appendix in the right lower quadrant. The terminal ileum is unremarkable. No free fluid or suspicious adenopathy.  Pelvis: Unremarkable bladder, uterus and adnexa. Trace free fluid is likely physiologic. No suspicious adenopathy. 2.9 simple cyst incidentally noted within the right ovary. This is a normal finding.  Bones/Soft Tissues: No acute fracture or aggressive appearing lytic or blastic osseous lesion.  Vascular: No significant atherosclerotic vascular disease, aneurysmal dilatation or acute abnormality.  IMPRESSION: 1. No acute abnormality in the abdomen or pelvis to explain the  patient's clinical symptoms. 2. Incidental note is made of a 2.9 cm simple right ovarian cyst. Although a normal finding in a reproductive age female, this could be a source of pain if the patient's pain localizes to the right lower quadrant/right pelvis.   Electronically Signed   By: Malachy Moan M.D.   On: 03/03/2015 08:41     Management plans discussed with the patient and in agreement.  CODE STATUS:     Code Status Orders        Start     Ordered   03/03/15 1105  Full code   Continuous     03/03/15 1104     TOTAL TIME TAKING CARE OF THIS PATIENT: 40 minutes.    Janesha Brissette M.D on 03/04/2015 at 10:53 AM  Between 7am to 6pm - Pager - [phone] After 6pm go to [email or web address] - password [email or web address] [facility]  [place]  [place] [facility]  Office  [phone]  CC: Primary care physician; No primary care provider on file.

## 2015-03-04 NOTE — Progress Notes (Signed)
Pt d/c home; d/c instructions reviewed w/ pt; pt understanding was verbalized; IV removed catheter in tact, gauze dressing applied; all pt questions answered; pt left unit via wheelchair accompanied by staff 

## 2015-03-14 ENCOUNTER — Encounter: Payer: Self-pay | Admitting: Obstetrics and Gynecology

## 2015-03-14 ENCOUNTER — Ambulatory Visit (INDEPENDENT_AMBULATORY_CARE_PROVIDER_SITE_OTHER): Payer: BLUE CROSS/BLUE SHIELD | Admitting: Obstetrics and Gynecology

## 2015-03-14 VITALS — BP 109/70 | HR 74 | Ht 65.0 in | Wt 132.2 lb

## 2015-03-14 DIAGNOSIS — N898 Other specified noninflammatory disorders of vagina: Secondary | ICD-10-CM

## 2015-03-14 DIAGNOSIS — R319 Hematuria, unspecified: Secondary | ICD-10-CM

## 2015-03-14 DIAGNOSIS — Z720 Tobacco use: Secondary | ICD-10-CM | POA: Insufficient documentation

## 2015-03-14 DIAGNOSIS — N946 Dysmenorrhea, unspecified: Secondary | ICD-10-CM

## 2015-03-14 DIAGNOSIS — N920 Excessive and frequent menstruation with regular cycle: Secondary | ICD-10-CM | POA: Diagnosis not present

## 2015-03-14 DIAGNOSIS — R102 Pelvic and perineal pain: Secondary | ICD-10-CM | POA: Diagnosis not present

## 2015-03-14 DIAGNOSIS — Z30011 Encounter for initial prescription of contraceptive pills: Secondary | ICD-10-CM | POA: Diagnosis not present

## 2015-03-14 LAB — POCT URINALYSIS DIPSTICK
Bilirubin, UA: 1
Glucose, UA: NEGATIVE
KETONES UA: NEGATIVE
Leukocytes, UA: NEGATIVE
Nitrite, UA: NEGATIVE
Protein, UA: 1
Urobilinogen, UA: 0.2
pH, UA: 6

## 2015-03-14 LAB — POCT URINE PREGNANCY: PREG TEST UR: NEGATIVE

## 2015-03-14 MED ORDER — NORETHINDRONE ACET-ETHINYL EST 1-20 MG-MCG PO TABS: 1.0000 | ORAL_TABLET | Freq: Every day | ORAL | Status: DC

## 2015-03-14 NOTE — Progress Notes (Signed)
GYN ENCOUNTER NOTE  Subjective:       Heather Duncan is a 19 y.o. G0P0000 female is here for gynecologic evaluation of the following issues:  1. Pelvic pain 2. Right ovarian cyst.     Er f/u from [facility]- 2.9 cm rt ovarian simple cyst seen on ct ([facility]).  [facility] did u/s. [facility] records requested. Wants birth control.  The patient is a 19 year old nulliparous female using condoms for contraception, with regular cycles Which are painful and heavy, presents for evaluation of new onset worsening pelvic pain refractory to conservative medical therapy. Several weeks ago Patient was hospitalized With CT scan demonstrating a small right ovarian cyst.  A week later.  She was also seen at [facility] in [place] where ultrasound demonstrated multiple follicles in both ovaries without any other acute findings. Patient describes the pain as severe, sharp, stabbing, right sided, made worse with sex. Patient states that her menses are heavy, lasting 7 days in duration and associated with clots.  She does have central cramping and occasional low back cramping.  Deep thrusting dyspareunia has developed recently. Patient states that she was on birth control pills in the past and these had lessens the heaviness of her menses and also decreased pain associated with them. Patient states that mom has had long-term problems with her cycles; she also has a maternal aunt and several cousins who have endometriosis.  Her mom was never diagnosed with endometriosis.      Gynecologic History Patient's last menstrual period was 02/27/2015 (exact date). Contraception: condoms  Menarche: Age 56 Intervals: 28-35 days Duration: 7 days, heavy with clots Positive history for dysmenorrhea; OCPs help regulate pain No history of STI   Obstetric History OB History  Gravida Para Term Preterm AB SAB TAB Ectopic Multiple Living         Past Medical History  Diagnosis Date  . Depression     Past  Surgical History  Procedure Laterality Date  . Wisdom tooth extraction  2012    Current Outpatient Prescriptions on File Prior to Visit  Medication Sig Dispense Refill  . DULoxetine (CYMBALTA) 60 MG capsule Take 60 mg by mouth daily.    Marland Kitchen lamoTRIgine (LAMICTAL) 100 MG tablet Take 100 mg by mouth daily.  3  . LORazepam (ATIVAN) 0.5 MG tablet Take 0.5 mg by mouth daily.  2  . ondansetron (ZOFRAN) 4 MG tablet Take 1 tablet (4 mg total) by mouth daily as needed for nausea or vomiting. 20 tablet 1  . traZODone (DESYREL) 100 MG tablet Take 100 mg by mouth daily as needed.  3   No current facility-administered medications on file prior to visit.    No Known Allergies  History   Social History  . Marital Status: Single    Spouse Name: N/A  . Number of Children: N/A  . Years of Education: N/A   Occupational History  . Not on file.   Social History Main Topics  . Smoking status: Light Tobacco Smoker    Types: Cigarettes  . Smokeless tobacco: Never Used  . Alcohol Use: Yes     Comment: occasionally  . Drug Use: 7.00 per week    Special: Marijuana     Comment: daily  . Sexual Activity: Yes    Birth Control/ Protection: None   Other Topics Concern  . Not on file   Social History Narrative    Family History  Problem  Relation Age of Onset  . Breast cancer Neg Hx   . Ovarian cancer Neg Hx   . Diabetes Neg Hx   . Heart disease Neg Hx     The following portions of the patient's history were reviewed and updated as appropriate: allergies, current medications, past family history, past medical history, past social history, past surgical history and problem list.  Review of Systems Review of Systems - General ROS: negative for - chills, fatigue, fever, hot flashes, malaise or night sweats Hematological and Lymphatic ROS: negative for - bleeding problems or swollen lymph nodes Gastrointestinal ROS: negative for - , blood in stools, change in bowel habits and  nausea/vomiting.POSITIVE-abdominal pain Musculoskeletal ROS: negative for - joint pain, muscle pain or muscular weakness Genito-Urinary ROS:  urinary frequency; negative for - change in menstrual cycle, dysuria, genital discharge, genital ulcers, hematuria, incontinence, nocturia. POSITIVE-Dysmenorrhea, dyspareunia, heavy menses, And Pelvic pain. Objective:   BP 109/70 mmHg  Pulse 74  Ht 5\' 5"  (1.651 m)  Wt 132 lb 3.2 oz (59.966 kg)  BMI 22.00 kg/m2  LMP 02/27/2015 (Exact Date) CONSTITUTIONAL: Well-developed, well-nourished female in no acute distress.  HENT:  Normocephalic, atraumatic.   SKIN: Skin is warm and dry. No rash noted. Not diaphoretic. No erythema. No pallor. NEUROLGIC: Alert and oriented to person, place, and time. PSYCHIATRIC: Normal mood and affect. Normal behavior. Normal judgment and thought content. CARDIOVASCULAR:Not Examined RESPIRATORY: Not Examined BREASTS: Not Examined ABDOMEN: Soft, non distended; Non tender.  No Organomegaly. PELVIC:  External Genitalia: Black macule on right thigh  BUS: Normal  Vagina: White discharge  Cervix: Cervical motion tenderness - 2/4  Uterus: Uterine tenderness - 2/4, mild posterior uterine tenderness on rectovaginal exam; normal size, shape,consistency, mobile  Adnexa: Normal  RV: Normal   Bladder: Nontender MUSCULOSKELETAL: Normal range of motion. No tenderness.  No cyanosis, clubbing, or edema.  PROCEDURE NOTE: Wet prep  NS/few WBCs, few clue cells, no trich KOH-No yeast or hyphae   Assessment:   1. Pelvic pain in female-Differential diagnosis includes pelvic adhesive disease, STI, endometriosis. - POCT urinalysis dipstick - GC/Chlamydia Probe Amp - Hepatitis B surface antigen - Hepatitis C antibody - Herpes simplex virus culture - HIV antibody - HSV(herpes simplex vrs) 1+2 ab-IgG - RPR  2. Vaginal discharge - GC/Chlamydia Probe Amp - Hepatitis B surface antigen - Hepatitis C antibody - Herpes simplex virus  culture - HIV antibody - HSV(herpes simplex vrs) 1+2 ab-IgG - RPR  3. Encounter for initial prescription of contraceptive pills - POCT urine pregnancy  4. Hematuria - Urine culture  5. Dysmenorrhea  6. Menorrhagia  7. Dyspareunia     Plan:   1. Urinalysis and culture; STI screen 2. Counseling on safe sex, risky behaviors 3. Phone patient with results 4. Schedule laparoscopy - possible endometriosis; 5. Start OCP - Microgestin 1-20 MG-MCG table;  6. Handout - procedure, endometriosis  7. The patient was counseled regarding the potential etiology to her pelvic pain.  She has a good history for possible endometriosis; it appears that several family members likely have had endometriosis.  Because of her worsening degree of acute pain/chronic pain, I recommended that we rule out the condition with laparoscopy.

## 2015-03-14 NOTE — Progress Notes (Signed)
Patient ID: Heather Duncan, female   DOB: 03-Aug-1996, 19 y.o.   MRN: [id number]   Er f/u from [facility]- 2.9 cm rt ovarian simple cyst seen on ct ([facility]  [facility] u[facility]s. [facility] records requested. Wants birth control.

## 2015-03-15 LAB — HEPATITIS C ANTIBODY: Hep C Virus Ab: 0.1 s/co ratio (ref 0.0–0.9)

## 2015-03-15 LAB — GC/CHLAMYDIA PROBE AMP
CHLAMYDIA, DNA PROBE: POSITIVE — AB
Neisseria gonorrhoeae by PCR: NEGATIVE

## 2015-03-15 LAB — HIV ANTIBODY (ROUTINE TESTING W REFLEX): HIV SCREEN 4TH GENERATION: NONREACTIVE

## 2015-03-15 LAB — HSV(HERPES SIMPLEX VRS) I + II AB-IGG

## 2015-03-15 LAB — RPR: RPR Ser Ql: NONREACTIVE

## 2015-03-15 LAB — HEPATITIS B SURFACE ANTIGEN: Hepatitis B Surface Ag: NEGATIVE

## 2015-03-16 LAB — URINE CULTURE

## 2015-03-18 ENCOUNTER — Telehealth: Payer: Self-pay

## 2015-03-18 DIAGNOSIS — B9620 Unspecified Escherichia coli [E. coli] as the cause of diseases classified elsewhere: Secondary | ICD-10-CM

## 2015-03-18 DIAGNOSIS — N390 Urinary tract infection, site not specified: Principal | ICD-10-CM

## 2015-03-18 MED ORDER — NITROFURANTOIN MONOHYD MACRO 100 MG PO CAPS: 100.0000 mg | ORAL_CAPSULE | Freq: Two times a day (BID) | ORAL | Status: DC

## 2015-03-18 NOTE — Telephone Encounter (Signed)
-----   Message from Herold HarmsMartin A Defrancesco, MD sent at 03/17/2015  7:38 PM EDT ----- Please notify - Abnormal Labs E coli UTI. Please call in Macrobid bid x 7 days

## 2015-03-18 NOTE — Telephone Encounter (Signed)
Med erx. Pt aware.  

## 2015-04-18 ENCOUNTER — Ambulatory Visit (INDEPENDENT_AMBULATORY_CARE_PROVIDER_SITE_OTHER): Payer: BLUE CROSS/BLUE SHIELD | Admitting: Obstetrics and Gynecology

## 2015-04-18 ENCOUNTER — Encounter: Payer: Self-pay | Admitting: Obstetrics and Gynecology

## 2015-04-18 ENCOUNTER — Encounter
Admission: RE | Admit: 2015-04-18 | Discharge: 2015-04-18 | Disposition: A | Discharge status: Home / Self Care | Payer: BLUE CROSS/BLUE SHIELD | Source: Ambulatory Visit | Attending: Obstetrics and Gynecology | Admitting: Obstetrics and Gynecology

## 2015-04-18 VITALS — BP 97/62 | HR 72 | Ht 65.0 in | Wt 127.6 lb

## 2015-04-18 DIAGNOSIS — Z01812 Encounter for preprocedural laboratory examination: Secondary | ICD-10-CM | POA: Diagnosis present

## 2015-04-18 DIAGNOSIS — R102 Pelvic and perineal pain: Secondary | ICD-10-CM

## 2015-04-18 DIAGNOSIS — Z01818 Encounter for other preprocedural examination: Secondary | ICD-10-CM

## 2015-04-18 HISTORY — DX: Chronic kidney disease, unspecified: N18.9

## 2015-04-18 HISTORY — DX: Gastro-esophageal reflux disease without esophagitis: K21.9

## 2015-04-18 HISTORY — DX: Anxiety disorder, unspecified: F41.9

## 2015-04-18 LAB — CBC WITH DIFFERENTIAL/PLATELET
BASOS PCT: 0 %
Basophils Absolute: 0 10*3/uL (ref 0–0.1)
Eosinophils Absolute: 0.2 10*3/uL (ref 0–0.7)
Eosinophils Relative: 2 %
HCT: 36.7 % (ref 35.0–47.0)
Hemoglobin: 12.3 g/dL (ref 12.0–16.0)
Lymphocytes Relative: 26 %
Lymphs Abs: 2.4 10*3/uL (ref 1.0–3.6)
MCH: 29.1 pg (ref 26.0–34.0)
MCHC: 33.5 g/dL (ref 32.0–36.0)
MCV: 87.1 fL (ref 80.0–100.0)
MONO ABS: 0.3 10*3/uL (ref 0.2–0.9)
Monocytes Relative: 3 %
Neutro Abs: 6.3 10*3/uL (ref 1.4–6.5)
Neutrophils Relative %: 69 %
Platelets: 237 10*3/uL (ref 150–440)
RBC: 4.22 MIL/uL (ref 3.80–5.20)
RDW: 13.4 % (ref 11.5–14.5)
WBC: 9.1 10*3/uL (ref 3.6–11.0)

## 2015-04-18 LAB — RAPID HIV SCREEN (HIV 1/2 AB+AG)
HIV 1/2 ANTIBODIES: NONREACTIVE
HIV-1 P24 Antigen - HIV24: NONREACTIVE

## 2015-04-18 NOTE — Progress Notes (Signed)
Patient ID: Heather Duncan, female   DOB: 04/15/1996, 19 y.o.   MRN: [id number]   Pre-op lap w/bx- 05/06/2015 Pelvic pain  Subjective:    Patient is a 19 y.o. G0P0021female scheduled for laparoscopy with peritoneal biopsies. Indications for procedure: Interval history:. 1. Pelvic pain 2. History of Right ovarian cyst.  3.  Possible endometriosis  Heather Duncan is a 19 y.o. G0P0000 female is here for gynecologic surgery evaluation of the following issues:  The patient is a 19 year old nulliparous nulliparous female using condoms for contraception,recently placed on oral contraceptives, with regular cycles Which are painful and heavy, presents for evaluation of new onset worsening pelvic pain refractory to conservative medical therapy. Several weeks ago Patient was hospitalized With CT scan demonstrating a small right ovarian cyst. A week later. She was also seen at [facility] in [place] where ultrasound demonstrated multiple follicles in both ovaries without any other acute findings. Patient describes the pain as severe, sharp, stabbing, right sided, made worse with sex. Patient states that her menses are heavy, lasting 7 days in duration and associated with clots. She does have central cramping and occasional low back cramping. Deep thrusting dyspareunia has developed recently. Patient states that she was on birth control pills in the past and these had lessens the heaviness of her menses and also decreased pain associated with them. Patient states that mom has had long-term problems with her cycles; she also has a maternal aunt and several cousins who have endometriosis. Her mom was never diagnosed with endometriosis.    Gynecologic History Patient's last menstrual period was 02/27/2015 (exact date). Contraception: condoms  Menarche: Age 35 Intervals: 28-35 days Duration: 7 days, heavy with clots Positive history for dysmenorrhea; OCPs help regulate pain No history of STI   Obstetric  History OB History  Gravida Para Term Preterm AB SAB TAB Ectopic Multiple Living                 Pertinent Gynecological History: See above HPI Discussed Blood/Blood Products: no   Menstrual History: OB History    Gravida Para Term Preterm AB TAB SAB Ectopic Multiple Living        Menarche age: NA  Patient's last menstrual period was 04/03/2015 (exact date).    Past Medical History  Diagnosis Date  . Depression     Past Surgical History  Procedure Laterality Date  . Wisdom tooth extraction  2012    OB History  Gravida Para Term Preterm AB SAB TAB Ectopic Multiple Living         History   Social History  . Marital Status: Single    Spouse Name: N/A  . Number of Children: N/A  . Years of Education: N/A   Social History Main Topics  . Smoking status: Never Smoker   . Smokeless tobacco: Never Used  . Alcohol Use: Yes     Comment: occasionally  . Drug Use: 7.00 per week    Special: Marijuana     Comment: daily  . Sexual Activity: Yes    Birth Control/ Protection: None   Other Topics Concern  . None   Social History Narrative    Family History  Problem Relation Age of Onset  . Breast cancer Neg Hx   . Ovarian cancer Neg Hx   . Diabetes Neg Hx   .  Heart disease Neg Hx      (Not in a hospital admission)  No Known Allergies  Review of Systems Constitutional: No recent fever/chills/sweats Respiratory: No recent cough/bronchitis Cardiovascular: No chest pain Gastrointestinal: No recent nausea/vomiting/diarrhea Genitourinary: No UTI symptoms Hematologic/lymphatic:No history of coagulopathy or recent blood thinner use    Objective:    BP 97/62 mmHg  Pulse 72  Ht 5\' 5"  (1.651 m)  Wt 127 lb 9.6 oz (57.879 kg)  BMI 21.23 kg/m2  LMP 04/03/2015 (Exact Date)  General:   Normal  Skin:   normal  HEENT:  Normal  Neck:  Supple without Adenopathy or  Thyromegaly  Lungs:   Heart:              Breasts:   Abdomen:  Pelvis:  M/S   Extremeties:  Neuro:    clear to auscultation bilaterally   Normal without murmur   Not Examined   soft, non-tender; bowel sounds normal; no masses,  no organomegaly   Exam deferred to OR  No CVAT  Warm/Dry   Normal          Assessment:    1.  Worsening pelvic pain. 2.  Possible endometriosis. 3.  History of right ovarian cyst. 4.  Family history of endometriosis   Plan:   1.  Laparoscopy with peritoneal biopsies.  Preoperative counseling: The patient is understanding of the planned procedure and is aware of and is accepting of all surgical risks which include but are not limited to bleeding, infection, pelvic organ injury with need for repair, blood clot disorders, anesthesia risks, etc.  All questions have been answered.  Informed consent is given.  Patient is ready and willing to proceed with surgery as scheduled.  Date of surgery 05/06/2015.

## 2015-04-18 NOTE — Patient Instructions (Addendum)
  Your procedure is scheduled on: May 06, 2015 (M0nday) Report to Day Surgery. To find out your arrival time please call [phone] between 1PM - 3PM on May 02, 2105 (Friday).  Remember: Instructions that are not followed completely may result in serious medical risk, up to and including death, or upon the discretion of your surgeon and anesthesiologist your surgery may need to be rescheduled.    __x__ 1. Do not eat food or drink liquids after midnight. No gum chewing or hard candies.     __x__ 2. No Alcohol for 24 hours before or after surgery.   ____ 3. Bring all medications with you on the day of surgery if instructed.    __x__ 4. Notify your doctor if there is any change in your medical condition     (cold, fever, infections).     Do not wear jewelry, make-up, hairpins, clips or nail polish.  Do not wear lotions, powders, or perfumes. You may wear deodorant.  Do not shave 48 hours prior to surgery. Men may shave face and neck.  Do not bring valuables to the hospital.    [facility] is not responsible for any belongings or valuables.               Contacts, dentures or bridgework may not be worn into surgery.  Leave your suitcase in the car. After surgery it may be brought to your room.  For patients admitted to the hospital, discharge time is determined by your                treatment team.   Patients discharged the day of surgery will not be allowed to drive home.   Please read over the following fact sheets that you were given:   Surgical Site Infection Prevention   __x__ Take these medicines the morning of surgery with A SIP OF WATER:    1. Ativan  2. Lamictal  3.   4.  5.  6.  ____ Fleet Enema (as directed)   __x__ Use CHG Soap as directed  ____ Use inhalers on the day of surgery  ____ Stop metformin 2 days prior to surgery    ____ Take 1/2 of usual insulin dose the night before surgery and none on the morning of surgery.   ____ Stop  Coumadin/Plavix/aspirin on   __x_ Stop Anti-inflammatories on (STOP IBUPROFEN)   ____ Stop supplements until after surgery.    ____ Bring C-Pap to the hospital.

## 2015-04-19 LAB — RPR: RPR: NONREACTIVE

## 2015-05-06 ENCOUNTER — Ambulatory Visit
Admission: RE | Admit: 2015-05-06 | Discharge: 2015-05-06 | Disposition: A | Discharge status: Home / Self Care | Payer: BLUE CROSS/BLUE SHIELD | Source: Ambulatory Visit | Attending: Obstetrics and Gynecology | Admitting: Obstetrics and Gynecology

## 2015-05-06 ENCOUNTER — Encounter
Admission: RE | Disposition: A | Discharge status: Home / Self Care | Payer: Self-pay | Source: Ambulatory Visit | Attending: Obstetrics and Gynecology

## 2015-05-06 ENCOUNTER — Ambulatory Visit: Payer: BLUE CROSS/BLUE SHIELD | Admitting: *Deleted

## 2015-05-06 DIAGNOSIS — N941 Dyspareunia: Secondary | ICD-10-CM | POA: Diagnosis not present

## 2015-05-06 DIAGNOSIS — Z842 Family history of other diseases of the genitourinary system: Secondary | ICD-10-CM | POA: Insufficient documentation

## 2015-05-06 DIAGNOSIS — Z79899 Other long term (current) drug therapy: Secondary | ICD-10-CM | POA: Diagnosis not present

## 2015-05-06 DIAGNOSIS — Z793 Long term (current) use of hormonal contraceptives: Secondary | ICD-10-CM | POA: Insufficient documentation

## 2015-05-06 DIAGNOSIS — N8320 Unspecified ovarian cysts: Secondary | ICD-10-CM | POA: Insufficient documentation

## 2015-05-06 DIAGNOSIS — N949 Unspecified condition associated with female genital organs and menstrual cycle: Secondary | ICD-10-CM | POA: Diagnosis not present

## 2015-05-06 DIAGNOSIS — Z791 Long term (current) use of non-steroidal anti-inflammatories (NSAID): Secondary | ICD-10-CM | POA: Insufficient documentation

## 2015-05-06 DIAGNOSIS — N946 Dysmenorrhea, unspecified: Secondary | ICD-10-CM

## 2015-05-06 DIAGNOSIS — F419 Anxiety disorder, unspecified: Secondary | ICD-10-CM | POA: Diagnosis not present

## 2015-05-06 DIAGNOSIS — Z8249 Family history of ischemic heart disease and other diseases of the circulatory system: Secondary | ICD-10-CM | POA: Insufficient documentation

## 2015-05-06 DIAGNOSIS — R102 Pelvic and perineal pain: Secondary | ICD-10-CM | POA: Insufficient documentation

## 2015-05-06 DIAGNOSIS — N736 Female pelvic peritoneal adhesions (postinfective): Secondary | ICD-10-CM | POA: Diagnosis not present

## 2015-05-06 DIAGNOSIS — F329 Major depressive disorder, single episode, unspecified: Secondary | ICD-10-CM | POA: Diagnosis not present

## 2015-05-06 DIAGNOSIS — G8929 Other chronic pain: Secondary | ICD-10-CM | POA: Diagnosis present

## 2015-05-06 DIAGNOSIS — K219 Gastro-esophageal reflux disease without esophagitis: Secondary | ICD-10-CM | POA: Diagnosis not present

## 2015-05-06 DIAGNOSIS — N189 Chronic kidney disease, unspecified: Secondary | ICD-10-CM | POA: Insufficient documentation

## 2015-05-06 HISTORY — PX: LAPAROSCOPY: SHX197

## 2015-05-06 LAB — URINE DRUG SCREEN, QUALITATIVE (ARMC ONLY)
Amphetamines, Ur Screen: NOT DETECTED
BENZODIAZEPINE, UR SCRN: NOT DETECTED
Barbiturates, Ur Screen: NOT DETECTED
Cannabinoid 50 Ng, Ur ~~LOC~~: POSITIVE — AB
Cocaine Metabolite,Ur ~~LOC~~: NOT DETECTED
MDMA (Ecstasy)Ur Screen: NOT DETECTED
METHADONE SCREEN, URINE: NOT DETECTED
OPIATE, UR SCREEN: NOT DETECTED
Phencyclidine (PCP) Ur S: NOT DETECTED
Tricyclic, Ur Screen: NOT DETECTED

## 2015-05-06 LAB — POCT PREGNANCY, URINE: Preg Test, Ur: NEGATIVE

## 2015-05-06 LAB — PREGNANCY, URINE: PREG TEST UR: NEGATIVE

## 2015-05-06 SURGERY — LAPAROSCOPY, DIAGNOSTIC
Anesthesia: General | Site: Abdomen | Wound class: Clean Contaminated

## 2015-05-06 MED ORDER — FENTANYL CITRATE (PF) 100 MCG/2ML IJ SOLN
25.0000 ug | INTRAMUSCULAR | Status: DC | PRN
  Administered 2015-05-06: 25 ug via INTRAVENOUS

## 2015-05-06 MED ORDER — OXYCODONE-ACETAMINOPHEN 5-325 MG PO TABS
ORAL_TABLET | ORAL | Status: AC
  Administered 2015-05-06: 1 via ORAL
  Filled 2015-05-06: qty 1

## 2015-05-06 MED ORDER — ONDANSETRON HCL 4 MG/2ML IJ SOLN
INTRAMUSCULAR | Status: DC | PRN
  Administered 2015-05-06: 4 mg via INTRAVENOUS

## 2015-05-06 MED ORDER — KETOROLAC TROMETHAMINE 30 MG/ML IJ SOLN: 30.0000 mg | Freq: Once | INTRAMUSCULAR | Status: DC | PRN

## 2015-05-06 MED ORDER — KETOROLAC TROMETHAMINE 30 MG/ML IJ SOLN
INTRAMUSCULAR | Status: AC
  Filled 2015-05-06: qty 1

## 2015-05-06 MED ORDER — FAMOTIDINE 20 MG PO TABS
20.0000 mg | ORAL_TABLET | Freq: Once | ORAL | Status: AC
  Administered 2015-05-06: 20 mg via ORAL

## 2015-05-06 MED ORDER — SCOPOLAMINE 1 MG/3DAYS TD PT72
MEDICATED_PATCH | TRANSDERMAL | Status: AC
  Administered 2015-05-06: 1.5 mg via TRANSDERMAL
  Filled 2015-05-06: qty 1

## 2015-05-06 MED ORDER — PROPOFOL 10 MG/ML IV BOLUS
INTRAVENOUS | Status: DC | PRN
  Administered 2015-05-06: 120 mg via INTRAVENOUS

## 2015-05-06 MED ORDER — GLYCOPYRROLATE 0.2 MG/ML IJ SOLN
INTRAMUSCULAR | Status: DC | PRN
  Administered 2015-05-06: 0.6 mg via INTRAVENOUS

## 2015-05-06 MED ORDER — ONDANSETRON HCL 4 MG/2ML IJ SOLN: 4.0000 mg | Freq: Once | INTRAMUSCULAR | Status: DC | PRN

## 2015-05-06 MED ORDER — OXYCODONE-ACETAMINOPHEN 5-325 MG PO TABS: 1.0000 | ORAL_TABLET | ORAL | Status: DC | PRN

## 2015-05-06 MED ORDER — FENTANYL CITRATE (PF) 100 MCG/2ML IJ SOLN
INTRAMUSCULAR | Status: DC | PRN
  Administered 2015-05-06 (×2): 50 ug via INTRAVENOUS

## 2015-05-06 MED ORDER — NEOSTIGMINE METHYLSULFATE 10 MG/10ML IV SOLN
INTRAVENOUS | Status: DC | PRN
  Administered 2015-05-06: 3 mg via INTRAVENOUS

## 2015-05-06 MED ORDER — MIDAZOLAM HCL 2 MG/2ML IJ SOLN
INTRAMUSCULAR | Status: DC | PRN
  Administered 2015-05-06: 2 mg via INTRAVENOUS

## 2015-05-06 MED ORDER — LACTATED RINGERS IV SOLN
INTRAVENOUS | Status: DC
  Administered 2015-05-06: 11:00:00 via INTRAVENOUS

## 2015-05-06 MED ORDER — FAMOTIDINE 20 MG PO TABS
ORAL_TABLET | ORAL | Status: AC
  Administered 2015-05-06: 20 mg via ORAL
  Filled 2015-05-06: qty 1

## 2015-05-06 MED ORDER — HYDROMORPHONE HCL 1 MG/ML IJ SOLN
INTRAMUSCULAR | Status: AC
  Administered 2015-05-06: 0.5 mg via INTRAVENOUS
  Filled 2015-05-06: qty 1

## 2015-05-06 MED ORDER — LIDOCAINE HCL (CARDIAC) 20 MG/ML IV SOLN
INTRAVENOUS | Status: DC | PRN
  Administered 2015-05-06: 60 mg via INTRAVENOUS

## 2015-05-06 MED ORDER — SCOPOLAMINE 1 MG/3DAYS TD PT72
1.0000 | MEDICATED_PATCH | TRANSDERMAL | Status: DC
  Administered 2015-05-06: 1.5 mg via TRANSDERMAL

## 2015-05-06 MED ORDER — OXYCODONE-ACETAMINOPHEN 5-325 MG PO TABS
1.0000 | ORAL_TABLET | ORAL | Status: DC | PRN
  Administered 2015-05-06: 1 via ORAL

## 2015-05-06 MED ORDER — ACETAMINOPHEN 10 MG/ML IV SOLN
INTRAVENOUS | Status: AC
  Filled 2015-05-06: qty 100

## 2015-05-06 MED ORDER — FENTANYL CITRATE (PF) 100 MCG/2ML IJ SOLN
INTRAMUSCULAR | Status: AC
  Administered 2015-05-06: 25 ug via INTRAVENOUS
  Filled 2015-05-06: qty 2

## 2015-05-06 MED ORDER — ACETAMINOPHEN 10 MG/ML IV SOLN
INTRAVENOUS | Status: DC | PRN
  Administered 2015-05-06: 1000 mg via INTRAVENOUS

## 2015-05-06 MED ORDER — DEXAMETHASONE SODIUM PHOSPHATE 4 MG/ML IJ SOLN
INTRAMUSCULAR | Status: DC | PRN
  Administered 2015-05-06: 10 mg via INTRAVENOUS

## 2015-05-06 MED ORDER — HYDROMORPHONE HCL 1 MG/ML IJ SOLN
INTRAMUSCULAR | Status: AC
  Filled 2015-05-06: qty 1

## 2015-05-06 MED ORDER — ROCURONIUM BROMIDE 100 MG/10ML IV SOLN
INTRAVENOUS | Status: DC | PRN
  Administered 2015-05-06: 30 mg via INTRAVENOUS

## 2015-05-06 MED ORDER — HYDROMORPHONE HCL 1 MG/ML IJ SOLN
0.2500 mg | INTRAMUSCULAR | Status: DC | PRN
  Administered 2015-05-06 (×4): 0.5 mg via INTRAVENOUS

## 2015-05-06 SURGICAL SUPPLY — 30 items
BLADE SURG SZ11 CARB STEEL (BLADE) ×3 IMPLANT
CANISTER SUCT 1200ML W/VALVE (MISCELLANEOUS) ×3 IMPLANT
CATH ROBINSON RED A/P 16FR (CATHETERS) ×3 IMPLANT
CHLORAPREP W/TINT 26ML (MISCELLANEOUS) ×3 IMPLANT
DRESSING TELFA 4X3 1S ST N-ADH (GAUZE/BANDAGES/DRESSINGS) ×3 IMPLANT
DRSG TEGADERM 2X2.25 PEDS (GAUZE/BANDAGES/DRESSINGS) ×6 IMPLANT
GLOVE BIO SURGEON STRL SZ8 (GLOVE) ×3 IMPLANT
GLOVE INDICATOR 8.0 STRL GRN (GLOVE) ×3 IMPLANT
GOWN STRL REUS W/ TWL LRG LVL3 (GOWN DISPOSABLE) ×1 IMPLANT
GOWN STRL REUS W/ TWL XL LVL3 (GOWN DISPOSABLE) ×1 IMPLANT
GOWN STRL REUS W/TWL LRG LVL3 (GOWN DISPOSABLE) ×2
GOWN STRL REUS W/TWL XL LVL3 (GOWN DISPOSABLE) ×2
IRRIGATION STRYKERFLOW (MISCELLANEOUS) ×1 IMPLANT
IRRIGATOR STRYKERFLOW (MISCELLANEOUS) ×3
IV LACTATED RINGERS 1000ML (IV SOLUTION) IMPLANT
KIT RM TURNOVER CYSTO AR (KITS) ×3 IMPLANT
LABEL OR SOLS (LABEL) IMPLANT
NS IRRIG 1000ML POUR BTL (IV SOLUTION) ×3 IMPLANT
NS IRRIG 500ML POUR BTL (IV SOLUTION) ×3 IMPLANT
PACK GYN LAPAROSCOPIC (MISCELLANEOUS) ×3 IMPLANT
PAD OB MATERNITY 4.3X12.25 (PERSONAL CARE ITEMS) ×3 IMPLANT
PAD PREP 24X41 OB/GYN DISP (PERSONAL CARE ITEMS) ×3 IMPLANT
POUCH ENDO CATCH 10MM SPEC (MISCELLANEOUS) IMPLANT
SCISSORS METZENBAUM CVD 33 (INSTRUMENTS) IMPLANT
SLEEVE ENDOPATH XCEL 5M (ENDOMECHANICALS) ×3 IMPLANT
SUT PLAIN 4 0 FS 2 27 (SUTURE) ×3 IMPLANT
SUT VIC AB 0 CT2 27 (SUTURE) ×3 IMPLANT
SUT VIC AB 0 UR5 27 (SUTURE) IMPLANT
TROCAR XCEL NON-BLD 5MMX100MML (ENDOMECHANICALS) ×3 IMPLANT
TUBING INSUFFLATOR HI FLOW (MISCELLANEOUS) ×3 IMPLANT

## 2015-05-06 NOTE — Progress Notes (Signed)
Patient ID: Heather Duncan, female   DOB: 1995-12-15, 19 y.o.   MRN: [id number]   Pre-op lap w/bx- 05/06/2015 Pelvic pain  Subjective:    Patient is a 19 y.o. G0P0016female scheduled for laparoscopy with peritoneal biopsies. Indications for procedure: Interval history:. 1. Pelvic pain 2. History of Right ovarian cyst.  3.  Possible endometriosis  Heather Duncan is a 19 y.o. G0P0000 female is here for gynecologic surgery evaluation of the following issues:  The patient is a 19 year old nulliparous female using condoms for contraception,recently placed on oral contraceptives, with regular cycles Which are painful and heavy, presents for evaluation of new onset worsening pelvic pain refractory to conservative medical therapy. Several weeks ago Patient was hospitalized With CT scan demonstrating a small right ovarian cyst. A week later. She was also seen at [facility] in [place] where ultrasound demonstrated multiple follicles in both ovaries without any other acute findings. Patient describes the pain as severe, sharp, stabbing, right sided, made worse with sex. Patient states that her menses are heavy, lasting 7 days in duration and associated with clots. She does have central cramping and occasional low back cramping. Deep thrusting dyspareunia has developed recently. Patient states that she was on birth control pills in the past and these had lessens the heaviness of her menses and also decreased pain associated with them. Patient states that mom has had long-term problems with her cycles; she also has a maternal aunt and several cousins who have endometriosis. Her mom was never diagnosed with endometriosis.    Gynecologic History Patient's last menstrual period was 02/27/2015 (exact date). Contraception: condoms  Menarche: Age 19 Intervals: 28-35 days Duration: 7 days, heavy with clots Positive history for dysmenorrhea; OCPs help regulate pain No history of STI   Obstetric  History OB History  Gravida Para Term Preterm AB SAB TAB Ectopic Multiple Living  0 0 0 0 0 0 0 0 0 0                 Pertinent Gynecological History: See above HPI Discussed Blood/Blood Products: no   Menstrual History: OB History    Gravida Para Term Preterm AB TAB SAB Ectopic Multiple Living   0 0 0 0 0 0 0 0 0 0       Menarche age: NA  Patient's last menstrual period was 04/28/2015 (approximate).    Past Medical History  Diagnosis Date  . Depression   . Anxiety   . GERD (gastroesophageal reflux disease)   . Chronic kidney disease     Past Surgical History  Procedure Laterality Date  . Wisdom tooth extraction  2012    OB History  Gravida Para Term Preterm AB SAB TAB Ectopic Multiple Living  0 0 0 0 0 0 0 0 0 0         Social History   Social History  . Marital Status: Single    Spouse Name: N/A  . Number of Children: N/A  . Years of Education: N/A   Social History Main Topics  . Smoking status: Never Smoker   . Smokeless tobacco: Never Used  . Alcohol Use: Yes     Comment: occasionally  . Drug Use: 7.00 per week    Special: Marijuana     Comment: daily  . Sexual Activity: Yes    Birth Control/ Protection: None   Other Topics Concern  . Not on file   Social History Narrative    Family History  Problem Relation Age of Onset  .  Breast cancer Neg Hx   . Ovarian cancer Neg Hx   . Diabetes Neg Hx   . Heart disease Neg Hx   . Hypertension Father     Prescriptions prior to admission  Medication Sig Dispense Refill Last Dose  . DULoxetine (CYMBALTA) 60 MG capsule Take 60 mg by mouth daily.   05/05/2015 at Unknown time  . lamoTRIgine (LAMICTAL) 100 MG tablet Take 100 mg by mouth daily.  3 05/06/2015 at 0945  . LORazepam (ATIVAN) 0.5 MG tablet Take 0.5 mg by mouth daily.  2 05/06/2015 at 0945  . norethindrone-ethinyl estradiol (MICROGESTIN) 1-20 MG-MCG tablet Take 1 tablet by mouth daily. 1 Package 11 05/05/2015 at Unknown  time  . traZODone (DESYREL) 100 MG tablet Take 100 mg by mouth daily as needed.  3 05/05/2015 at Unknown time  . ibuprofen (ADVIL,MOTRIN) 800 MG tablet Take 800 mg by mouth.   04/29/2015    No Known Allergies  Review of Systems Constitutional: No recent fever/chills/sweats Respiratory: No recent cough/bronchitis Cardiovascular: No chest pain Gastrointestinal: No recent nausea/vomiting/diarrhea Genitourinary: No UTI symptoms Hematologic/lymphatic:No history of coagulopathy or recent blood thinner use    Objective:    BP 106/63 mmHg  Pulse 95  Temp(Src) 98.5 F (36.9 C) (Oral)  Resp 16  SpO2 99%  LMP 04/28/2015 (Approximate)  General:   Normal  Skin:   normal  HEENT:  Normal  Neck:  Supple without Adenopathy or Thyromegaly  Lungs:   Heart:              Breasts:   Abdomen:  Pelvis:  M/S   Extremeties:  Neuro:    clear to auscultation bilaterally   Normal without murmur   Not Examined   soft, non-tender; bowel sounds normal; no masses,  no organomegaly   Exam deferred to OR  No CVAT  Warm/Dry   Normal          Assessment:    1.  Worsening pelvic pain. 2.  Possible endometriosis. 3.  History of right ovarian cyst. 4.  Family history of endometriosis   Plan:   1.  Laparoscopy with peritoneal biopsies.  Preoperative counseling: The patient is understanding of the planned procedure and is aware of and is accepting of all surgical risks which include but are not limited to bleeding, infection, pelvic organ injury with need for repair, blood clot disorders, anesthesia risks, etc.  All questions have been answered.  Informed consent is given.  Patient is ready and willing to proceed with surgery as scheduled.  Date of surgery 05/06/2015.  Date of Initial H&P: 04/13/2015  History reviewed, patient examined, no change in status, stable for surgery.

## 2015-05-06 NOTE — OR Nursing (Signed)
lower abd incision bleeding.  Redressed with gauze and tegaderm.

## 2015-05-06 NOTE — Discharge Instructions (Addendum)

## 2015-05-06 NOTE — Op Note (Signed)
Heather Duncan PROCEDURE DATE: 05/06/2015 1:57 PM  PREOPERATIVE DIAGNOSIS: CHRONIC PELVIC PAIN POSTOPERATIVE DIAGNOSIS: CHRONIC PELVIC PAIN AND SIMPLE RIGHT OVARIAN CYST PROCEDURE: Procedure(s): LAPAROSCOPY DIAGNOSTIC AND BIOPSIES (N/A) SURGEON:  Dr. Daphine Deutscher A [name] ASSISTANT: None ANESTHESIA: General Endotracheal  INDICATIONS: 19 y.o. G0P0000 with history of chronic pelvic pain concerning for endometriosis desiring surgical evaluation.   Please see preoperative notes for further details.   FINDINGS:  Small uterus, normal ovaries and fallopian tubes bilaterally. Uterus, tubes and ovaries were normal; Rt ovarian cyst 2 cm was noted. Hyperemia of uterine serosa, bladder flap was noted. Lt pelvic sidewall/bowel adhesion with henmorrhagic lesion was biopsied. Uterosacral ligament white nodule was biopsied. Red lesion in cul de sac was biopsied.Hyperemic bladder flap serosa was biopsied. I/O's: Total I/O In: -  Out: 150 [Urine:150] SPECIMENS: Peritoneal biopsies Lt Pelvic sidewall adhesions; Uterosacral ligament; cul de sac COMPLICATIONS: None immediate COUNTS:  YES  PROCEDURE IN DETAIL: The patient was brought to the operating room where she was placed in the supine position.  General endotracheal anesthesia was induced without difficulty.  She was placed in the dorsal lithotomy position using bumblebee stirrups.  A ChloraPrep and Betadine, abdominal, perineal, intravaginal prep and drape was performed in standard fashion.  The timeout was completed.  The red Robison catheter was used to drain the bladder of clear urine.  A weighted speculum was placed into the vagina and a Hulka tenaculum was placed onto the cervix to facilitate uterine manipulation. Laparoscopy was performed in standard fashion.  The Optiview 5 mm trocar and sleeve were placed through a 5 mm subumbilical incision directly into the abdominal pelvic cavity, without evidence of bowel or vascular injury. 1 Additional 5 mm port was  placed in the lower quadrant under direct visualization.  The above noted findings were photo documented. Biopsies with biopsy forceps were taken as noted. Good hemostasis post biopsies. Upon completion of the procedures and inspection for hemostasis, the surgery was complete with all instrumentation being removed from the abdominal pelvic cavity.  The pneumoperitoneum was released.  The incisions were closed with 4 0 Plain sutures. Telfa/Tegaderm dressings were applied.  The patient was awakened, extubated, and taken to the recovery room in satisfactory condition.  Herold Harms, MD [facility]

## 2015-05-06 NOTE — Anesthesia Procedure Notes (Signed)
Procedure Name: Intubation Date/Time: 05/06/2015 12:51 PM Performed by: Irving Burton Pre-anesthesia Checklist: Patient identified, Emergency Drugs available, Suction available and Patient being monitored Patient Re-evaluated:Patient Re-evaluated prior to inductionOxygen Delivery Method: Circle system utilized Preoxygenation: Pre-oxygenation with 100% oxygen Intubation Type: IV induction Ventilation: Mask ventilation without difficulty Laryngoscope Size: Mac and 3 Grade View: Grade I Tube type: Oral Tube size: 7.0 mm Number of attempts: 1 Airway Equipment and Method: Patient positioned with wedge pillow and Stylet Placement Confirmation: ETT inserted through vocal cords under direct vision,  positive ETCO2 and breath sounds checked- equal and bilateral Secured at: 21 cm Tube secured with: Tape Dental Injury: Teeth and Oropharynx as per pre-operative assessment

## 2015-05-06 NOTE — Anesthesia Preprocedure Evaluation (Addendum)
Anesthesia Evaluation  Patient identified by MRN, date of birth, ID band Patient awake    Reviewed: Allergy & Precautions, H&P , NPO status , Patient's Chart, lab work & pertinent test results  Airway Mallampati: I  TM Distance: >3 FB Neck ROM: Full    Dental  (+) Teeth Intact   Pulmonary Current Smoker,  breath sounds clear to auscultation  Pulmonary exam normal       Cardiovascular Exercise Tolerance: Good Normal cardiovascular examRhythm:regular Rate:Normal     Neuro/Psych Depression    GI/Hepatic   Endo/Other    Renal/GU Renal disease     Musculoskeletal   Abdominal Normal abdominal exam  (+)   Peds  Hematology   Anesthesia Other Findings Past Medical History:   Depression                                                   Anxiety                                                      GERD (gastroesophageal reflux disease)                       Chronic kidney disease                                       Reproductive/Obstetrics Beta HCG is negative today.                            Anesthesia Physical Anesthesia Plan  ASA: III  Anesthesia Plan: General ETT   Post-op Pain Management:    Induction: Intravenous  Airway Management Planned: Oral ETT  Additional Equipment:   Intra-op Plan:   Post-operative Plan: Extubation in OR  Informed Consent: I have reviewed the patients History and Physical, chart, labs and discussed the procedure including the risks, benefits and alternatives for the proposed anesthesia with the patient or authorized representative who has indicated his/her understanding and acceptance.     Plan Discussed with: CRNA  Anesthesia Plan Comments:        Anesthesia Quick Evaluation

## 2015-05-06 NOTE — OR Nursing (Signed)
Dr. Dimple Casey notified via phone of results of UDS.  Aware of positive features  Of opiates and canabanoid.  Okay to proceed.

## 2015-05-06 NOTE — Transfer of Care (Signed)
Immediate Anesthesia Transfer of Care Note  Patient: Heather Duncan  Procedure(s) Performed: Procedure(s): LAPAROSCOPY DIAGNOSTIC AND BIOPSIES (N/A)  Patient Location: PACU  Anesthesia Type:General  Level of Consciousness: awake, alert  and oriented  Airway & Oxygen Therapy: Patient Spontanous Breathing and Patient connected to face mask oxygen  Post-op Assessment: Report given to RN and Post -op Vital signs reviewed and stable  Post vital signs: Reviewed and stable  Last Vitals:  Filed Vitals:   05/06/15 1357  BP: 147/95  Pulse: 106  Temp: 36.2 C  Resp: 22    Complications: No apparent anesthesia complications

## 2015-05-06 NOTE — H&P (Signed)
Heather Harms, MD Physician Signed Obstetrics/Gynecology Progress Notes 05/06/2015 10:54 AM    Expand All Collapse All   Patient ID: Heather Duncan, female DOB: [birth year], 19 y.o. MRN: [id number]   Pre-op lap w/bx- 05/06/2015 Pelvic pain  Subjective:    Patient is a 19 y.o. G0P0062female scheduled for laparoscopy with peritoneal biopsies. Indications for procedure: Interval history:. 1. Pelvic pain 2. History of Right ovarian cyst.  3. Possible endometriosis  Heather Duncan is a 19 y.o. G0P0000 female is here for gynecologic surgery evaluation of the following issues:  The patient is a 19 year old nulliparous female using condoms for contraception,recently placed on oral contraceptives, with regular cycles Which are painful and heavy, presents for evaluation of new onset worsening pelvic pain refractory to conservative medical therapy. Several weeks ago Patient was hospitalized With CT scan demonstrating a small right ovarian cyst. A week later. She was also seen at [facility] in [employer] where ultrasound demonstrated multiple follicles in both ovaries without any other acute findings. Patient describes the pain as severe, sharp, stabbing, right sided, made worse with sex. Patient states that her menses are heavy, lasting 7 days in duration and associated with clots. She does have central cramping and occasional low back cramping. Deep thrusting dyspareunia has developed recently. Patient states that she was on birth control pills in the past and these had lessens the heaviness of her menses and also decreased pain associated with them. Patient states that mom has had long-term problems with her cycles; she also has a maternal aunt and several cousins who have endometriosis. Her mom was never diagnosed with endometriosis.    Gynecologic History Patient's last menstrual period was 02/27/2015 (exact date). Contraception: condoms  Menarche: Age 43 Intervals: 28-35  days Duration: 7 days, heavy with clots Positive history for dysmenorrhea; OCPs help regulate pain No history of STI   Obstetric History OB History  Gravida Para Term Preterm AB SAB TAB Ectopic Multiple Living                 Pertinent Gynecological History: See above HPI Discussed Blood/Blood Products: no  Menstrual History: OB History    Gravida Para Term Preterm AB TAB SAB Ectopic Multiple Living        Menarche age: NA  Patient's last menstrual period was 04/28/2015 (approximate).    Past Medical History  Diagnosis Date  . Depression   . Anxiety   . GERD (gastroesophageal reflux disease)   . Chronic kidney disease     Past Surgical History  Procedure Laterality Date  . Wisdom tooth extraction  2012    OB History  Gravida Para Term Preterm AB SAB TAB Ectopic Multiple Living         Social History   Social History  . Marital Status: Single    Spouse Name: N/A  . Number of Children: N/A  . Years of Education: N/A   Social History Main Topics  . Smoking status: Never Smoker   . Smokeless tobacco: Never Used  . Alcohol Use: Yes     Comment: occasionally  . Drug Use: 7.00 per week    Special: Marijuana     Comment: daily  . Sexual Activity: Yes    Birth Control/ Protection: None   Other Topics Concern  . Not on file  Social History Narrative    Family History  Problem Relation Age of Onset  . Breast cancer Neg Hx   . Ovarian cancer Neg Hx   . Diabetes Neg Hx   . Heart disease Neg Hx   . Hypertension Father     Prescriptions prior to admission  Medication Sig Dispense Refill Last Dose  . DULoxetine (CYMBALTA) 60 MG capsule Take 60 mg by  mouth daily.   05/05/2015 at Unknown time  . lamoTRIgine (LAMICTAL) 100 MG tablet Take 100 mg by mouth daily.  3 05/06/2015 at 0945  . LORazepam (ATIVAN) 0.5 MG tablet Take 0.5 mg by mouth daily.  2 05/06/2015 at 0945  . norethindrone-ethinyl estradiol (MICROGESTIN) 1-20 MG-MCG tablet Take 1 tablet by mouth daily. 1 Package 11 05/05/2015 at Unknown time  . traZODone (DESYREL) 100 MG tablet Take 100 mg by mouth daily as needed.  3 05/05/2015 at Unknown time  . ibuprofen (ADVIL,MOTRIN) 800 MG tablet Take 800 mg by mouth.   04/29/2015    No Known Allergies  Review of Systems Constitutional: No recent fever/chills/sweats Respiratory: No recent cough/bronchitis Cardiovascular: No chest pain Gastrointestinal: No recent nausea/vomiting/diarrhea Genitourinary: No UTI symptoms Hematologic/lymphatic:No history of coagulopathy or recent blood thinner use   Objective:    BP 106/63 mmHg  Pulse 95  Temp(Src) 98.5 F (36.9 C) (Oral)  Resp 16  SpO2 99%  LMP 04/28/2015 (Approximate)  General:  Normal  Skin:  normal  HEENT: Normal  Neck: Supple without Adenopathy or Thyromegaly  Lungs:   Heart:    Breasts:   Abdomen:  Pelvis:  M/S   Extremeties:  Neuro:   clear to auscultation bilaterally  Normal without murmur  Not Examined  soft, non-tender; bowel sounds normal; no masses, no organomegaly  Exam deferred to OR  No CVAT  Warm/Dry   Normal          Assessment:   1. Worsening pelvic pain. 2. Possible endometriosis. 3. History of right ovarian cyst. 4. Family history of endometriosis  Plan:   1. Laparoscopy with peritoneal biopsies.  Preoperative counseling: The patient is understanding of the planned procedure and is aware of and is accepting of all surgical risks which include but are not limited to bleeding, infection, pelvic organ injury with need for repair, blood clot  disorders, anesthesia risks, etc. All questions have been answered. Informed consent is given. Patient is ready and willing to proceed with surgery as scheduled. Date of surgery 05/06/2015.  Date of Initial H&P: 04/18/2015  History reviewed, patient examined, no change in status, stable for surgery.

## 2015-05-06 NOTE — Anesthesia Postprocedure Evaluation (Signed)
  Anesthesia Post-op Note  Patient: Heather Duncan  Procedure(s) Performed: Procedure(s): LAPAROSCOPY DIAGNOSTIC AND BIOPSIES (N/A)  Anesthesia type:General ETT  Patient location: PACU  Post pain: Pain level controlled  Post assessment: Post-op Vital signs reviewed, Patient's Cardiovascular Status Stable, Respiratory Function Stable, Patent Airway and No signs of Nausea or vomiting  Post vital signs: Reviewed and stable  Last Vitals:  Filed Vitals:   05/06/15 1501  BP: 113/56  Pulse: 81  Temp: 35.9 C  Resp: 16    Level of consciousness: awake, alert  and patient cooperative  Complications: No apparent anesthesia complications

## 2015-05-07 ENCOUNTER — Encounter: Payer: Self-pay | Admitting: Obstetrics and Gynecology

## 2015-05-07 LAB — SURGICAL PATHOLOGY

## 2015-05-09 ENCOUNTER — Telehealth: Payer: Self-pay | Admitting: Obstetrics and Gynecology

## 2015-05-09 NOTE — Telephone Encounter (Signed)
Pt called and she had surgery this past Monday and her college courses have started and she does not feel well enough to go back right now, so she wanted to see if we could give her a note excusing her from classes this week, and she wanted [place] to fax it to [phone] Attn Joycie Peek.

## 2015-05-10 NOTE — Telephone Encounter (Signed)
Pt aware per vm. Letter has been faxed with confirmation.

## 2015-05-16 ENCOUNTER — Ambulatory Visit (INDEPENDENT_AMBULATORY_CARE_PROVIDER_SITE_OTHER): Payer: BLUE CROSS/BLUE SHIELD | Admitting: Obstetrics and Gynecology

## 2015-05-16 ENCOUNTER — Encounter: Payer: Self-pay | Admitting: Obstetrics and Gynecology

## 2015-05-16 VITALS — BP 111/75 | HR 84 | Ht 65.0 in | Wt 125.9 lb

## 2015-05-16 DIAGNOSIS — R102 Pelvic and perineal pain: Secondary | ICD-10-CM

## 2015-05-16 DIAGNOSIS — Z09 Encounter for follow-up examination after completed treatment for conditions other than malignant neoplasm: Secondary | ICD-10-CM

## 2015-05-16 DIAGNOSIS — N809 Endometriosis, unspecified: Secondary | ICD-10-CM

## 2015-05-16 NOTE — Patient Instructions (Signed)
1. Will notify when Lupron comes in. 2. Resume all activities.

## 2015-05-16 NOTE — Progress Notes (Signed)
Patient ID: Heather Duncan, female   DOB: [birth year], 19 y.o.   MRN: [id number]  Chief complaint: 1.  One week postop check. 2.  Status post laparoscopy with peritoneal biopsies.    1 week post op S/p lap with bx- No fevers No draining from incision Bowel/bladder- normal function  Findings from surgery were reviewed. Pathology demonstrated adhesions, hemosiderin laden macrophages, without evidence of obvious endometriosis. Patient does understand that the findings at time of surgery were very suspicious for endometriosis. Options of managing chronic pelvic pain were reviewed.  Patient and mom are opting for trial of Depo-Lupron therapy.  IMPRESSION: 1.  One week postop check status post laparoscopy with peritoneal biopsies. 2.  Suspected endometriosis.  PLAN: 1.  Resume all activities without restriction. 2.  Preauthorized for Lupron therapy. 3.  Patient will return for first injection when medication is available.  She will be given at back therapy to minimize side effects.

## 2015-05-31 ENCOUNTER — Telehealth: Payer: Self-pay | Admitting: Obstetrics and Gynecology

## 2015-05-31 NOTE — Telephone Encounter (Signed)
Pharmacist from [employer] called and said Heather Duncan left her zofran at college and is home for the w/e. Her dad called the pharmacy and said she was very sick and throwing up. The pharmacist called [place]. Can you send a RX for Zofran to [facility]

## 2015-05-31 NOTE — Telephone Encounter (Signed)
Lennox Laity- pharmacist at [facility] aware mad has never filled zofran for pt.

## 2015-06-16 ENCOUNTER — Encounter: Payer: Self-pay | Admitting: Emergency Medicine

## 2015-06-16 ENCOUNTER — Emergency Department
Admission: EM | Admit: 2015-06-16 | Discharge: 2015-06-16 | Disposition: A | Discharge status: Home / Self Care | Payer: BLUE CROSS/BLUE SHIELD | Attending: Emergency Medicine | Admitting: Emergency Medicine

## 2015-06-16 DIAGNOSIS — R079 Chest pain, unspecified: Secondary | ICD-10-CM | POA: Diagnosis present

## 2015-06-16 DIAGNOSIS — Z720 Tobacco use: Secondary | ICD-10-CM | POA: Diagnosis not present

## 2015-06-16 DIAGNOSIS — F410 Panic disorder [episodic paroxysmal anxiety] without agoraphobia: Secondary | ICD-10-CM

## 2015-06-16 DIAGNOSIS — F419 Anxiety disorder, unspecified: Secondary | ICD-10-CM | POA: Insufficient documentation

## 2015-06-16 DIAGNOSIS — Z793 Long term (current) use of hormonal contraceptives: Secondary | ICD-10-CM | POA: Insufficient documentation

## 2015-06-16 DIAGNOSIS — Z79899 Other long term (current) drug therapy: Secondary | ICD-10-CM | POA: Insufficient documentation

## 2015-06-16 DIAGNOSIS — R200 Anesthesia of skin: Secondary | ICD-10-CM | POA: Insufficient documentation

## 2015-06-16 MED ORDER — LORAZEPAM 0.5 MG PO TABS: 0.5000 mg | ORAL_TABLET | Freq: Every day | ORAL | Status: AC

## 2015-06-16 NOTE — ED Provider Notes (Signed)
[facility] Emergency Department Provider Note  ____________________________________________    I have reviewed the triage vital signs and the nursing notes.   HISTORY  Chief Complaint Chest Pain    HPI Heather Duncan is a 19 y.o. female with a history of anxiety who reports chest pain today. She reports she felt anxious on the way to work and then developed chest pain and entire body was tingling and felt numb. She denies shortness of breath. She felt better with rest and continued about her day. She had a similar episode later which resulted in mild chest pressure and left arm tingling. No recent travel. No calf pain. No fevers no chills. No nausea no vomiting. She sees her psychiatrist on Tuesday     Past Medical History  Diagnosis Date  . Depression   . Anxiety   . GERD (gastroesophageal reflux disease)   . Chronic kidney disease     Patient Active Problem List   Diagnosis Date Noted  . Endometriosis 05/16/2015  . Pelvic pain in female 03/14/2015  . Menorrhagia 03/14/2015  . Dysmenorrhea 03/14/2015  . Tobacco user 03/14/2015  . Gastroenteritis, acute 03/03/2015  . AA (alcohol abuse) 04/03/2014  . Benzodiazepine dependence 04/03/2014  . Cluster B personality disorder 04/03/2014  . Cocaine abuse 04/03/2014  . Clinical depression 04/03/2014  . Cannabis abuse 04/03/2014  . Self-destructive behavior 04/03/2014  . Intentional acetaminophen overdose 04/03/2014    Past Surgical History  Procedure Laterality Date  . Wisdom tooth extraction  2012  . Laparoscopy N/A 05/06/2015    Procedure: LAPAROSCOPY DIAGNOSTIC AND BIOPSIES;  Surgeon: Herold Harms, MD;  Location: [facility] ORS;  Service: Gynecology;  Laterality: N/A;    Current Outpatient Rx  Name  Route  Sig  Dispense  Refill  . DULoxetine (CYMBALTA) 60 MG capsule   Oral   Take 60 mg by mouth daily.         Marland Kitchen lamoTRIgine (LAMICTAL) 25 MG tablet   Oral   Take 50 mg by mouth daily.          Marland Kitchen LORazepam (ATIVAN) 0.5 MG tablet   Oral   Take 0.5 mg by mouth daily.      2   . norethindrone-ethinyl estradiol (MICROGESTIN) 1-20 MG-MCG tablet   Oral   Take 1 tablet by mouth daily.   1 Package   11   . traZODone (DESYREL) 100 MG tablet   Oral   Take 100 mg by mouth daily as needed.      3   . lamoTRIgine (LAMICTAL) 100 MG tablet   Oral   Take 100 mg by mouth daily.      3   . ondansetron (ZOFRAN-ODT) 4 MG disintegrating tablet      dissolve 1 tablet by mouth every 8 hours if needed for nausea for UP TO 7 DAYS      0     Allergies Review of patient's allergies indicates no known allergies.  Family History  Problem Relation Age of Onset  . Breast cancer Neg Hx   . Ovarian cancer Neg Hx   . Diabetes Neg Hx   . Heart disease Neg Hx   . Hypertension Father     Social History Social History  Substance Use Topics  . Smoking status: Current Some Day Smoker  . Smokeless tobacco: Never Used  . Alcohol Use: Yes     Comment: occasionally    Review of Systems  Constitutional: Negative for fever. Eyes: Negative  for visual changes. ENT: Negative for sore throat Cardiovascular: Positive for chest pain earlier today Respiratory: Negative for shortness of breath. Gastrointestinal: Negative for abdominal pain, vomiting and diarrhea. Genitourinary: Negative for dysuria. Musculoskeletal: Negative for back pain. Skin: Negative for rash. Neurological: Negative for headaches or focal weakness Psychiatric: Positive for anxiety    ____________________________________________   PHYSICAL EXAM:  VITAL SIGNS: ED Triage Vitals  Enc Vitals Group     BP 06/16/15 1945 126/82 mmHg     Pulse Rate 06/16/15 1945 87     Resp 06/16/15 1945 18     Temp 06/16/15 1945 98.4 F (36.9 C)     Temp Source 06/16/15 1945 Oral     SpO2 06/16/15 1945 98 %     Weight 06/16/15 1945 130 lb (58.968 kg)     Height 06/16/15 1945  (1.651 m)     Head Cir --      Peak  Flow --      Pain Score --      Pain Loc --      Pain Edu? --      Excl. in GC? --      Constitutional: Alert and oriented. Well appearing and in no distress. Eyes: Conjunctivae are normal.  ENT   Head: Normocephalic and atraumatic.   Mouth/Throat: Mucous membranes are moist. Cardiovascular: Normal rate, regular rhythm. Normal and symmetric distal pulses are present in all extremities. No murmurs, rubs, or gallops. Respiratory: Normal respiratory effort without tachypnea nor retractions. Breath sounds are clear and equal bilaterally.  Gastrointestinal: Soft and non-tender in all quadrants. No distention. There is no CVA tenderness. Genitourinary: deferred Musculoskeletal: Nontender with normal range of motion in all extremities. No lower extremity tenderness nor edema. Neurologic:  Normal speech and language. No gross focal neurologic deficits are appreciated. Skin:  Skin is warm, dry and intact. No rash noted. Psychiatric: Mood and affect are normal. Patient exhibits appropriate insight and judgment.  ____________________________________________    LABS (pertinent positives/negatives)  Labs Reviewed - No data to display  ____________________________________________   EKG  ED ECG REPORT I, Jene Every, the attending physician, personally viewed and interpreted this ECG.  Date: 06/16/2015 EKG Time: 7:52 PM Rate: 77 Rhythm: normal sinus rhythm QRS Axis: normal Intervals: normal ST/T Wave abnormalities: normal Conduction Disutrbances: none Narrative Interpretation: unremarkable  ____________________________________________    RADIOLOGY I have personally reviewed any xrays that were ordered on this patient: None  ____________________________________________   PROCEDURES Procedure(s) performed: none  Critical Care performed: none  ____________________________________________   INITIAL IMPRESSION / ASSESSMENT AND PLAN / ED COURSE  Pertinent labs &  imaging results that were available during my care of the patient were reviewed by me and considered in my medical decision making (see chart for details).  Patient well-appearing and in no distress. Her EKG is normal. She feels fine right now. Her vital signs are unremarkable. Her history of present illness is consistent with a panic attack. She has had these before and reports it did feel similar. She sees her psychiatrist in 2 days. At this time I feel no further workup is necessary. I have asked her to return if any change in her symptoms.  ____________________________________________   FINAL CLINICAL IMPRESSION(S) / ED DIAGNOSES  Final diagnoses:  Anxiety attack     Jene Every, MD 06/16/15 2251

## 2015-06-16 NOTE — Discharge Instructions (Signed)

## 2015-06-16 NOTE — ED Notes (Addendum)
Patient reports chest pain all day.  Reports history of anxiety and panic attacks.  Patient reports she was going to work and was running late and felt a little anxious but not much.  Then left arm started tingling and then whole body felt numb.  Several hours later when she was driving to get something thing to eat she again had pain in upper chest and left arm felt tingling.  Patient with skin warm and dry, color within normal limits.  No acute distress noted.

## 2015-06-20 ENCOUNTER — Ambulatory Visit (INDEPENDENT_AMBULATORY_CARE_PROVIDER_SITE_OTHER): Payer: BLUE CROSS/BLUE SHIELD

## 2015-06-20 VITALS — BP 98/64 | HR 92 | Ht 65.0 in | Wt 129.9 lb

## 2015-06-20 DIAGNOSIS — N809 Endometriosis, unspecified: Secondary | ICD-10-CM

## 2015-06-20 MED ORDER — LEUPROLIDE ACETATE 3.75 MG IM KIT
3.7500 mg | PACK | Freq: Once | INTRAMUSCULAR | Status: AC
  Administered 2015-06-20: 3.75 mg via INTRAMUSCULAR

## 2015-06-20 NOTE — Progress Notes (Signed)
Patient ID: Heather Duncan, female   DOB: 12-25-1995, 19 y.o.   MRN: [id number] Pt presents for 1st lupron inj. Pt is aware to stop ocp but to use condoms.

## 2015-07-25 ENCOUNTER — Telehealth: Payer: Self-pay

## 2015-07-31 MED ORDER — AZITHROMYCIN 1 G PO PACK: 1.0000 g | PACK | Freq: Once | ORAL | Status: DC

## 2015-07-31 NOTE — Telephone Encounter (Signed)
Pt aware. Zithromax 1gm erx. Pt aware to have partners treated. NO Ic for 7-10 d after treatment. TOC appt 10-31-15.

## 2015-08-09 ENCOUNTER — Ambulatory Visit (INDEPENDENT_AMBULATORY_CARE_PROVIDER_SITE_OTHER): Payer: BLUE CROSS/BLUE SHIELD | Admitting: Obstetrics and Gynecology

## 2015-08-09 VITALS — BP 113/82 | HR 85 | Ht 65.0 in | Wt 141.6 lb

## 2015-08-09 DIAGNOSIS — N809 Endometriosis, unspecified: Secondary | ICD-10-CM | POA: Diagnosis not present

## 2015-08-09 MED ORDER — LEUPROLIDE ACETATE 3.75 MG IM KIT
3.7500 mg | PACK | Freq: Once | INTRAMUSCULAR | Status: AC
  Administered 2015-08-09: 3.75 mg via INTRAMUSCULAR

## 2015-08-09 NOTE — Progress Notes (Signed)
Patient ID: Heather Duncan, female   DOB: 1996-09-11, 19 y.o.   MRN: [id number] Pt presents for 2nd Lupron injection for Endometriosis. Pt states she is not really having any side effects of medication. Does have cold sweats but has these before. Add back therapy, norethrindrone medication given to pt. That came with Lupron.

## 2015-09-06 ENCOUNTER — Ambulatory Visit (INDEPENDENT_AMBULATORY_CARE_PROVIDER_SITE_OTHER): Payer: BLUE CROSS/BLUE SHIELD

## 2015-09-06 VITALS — BP 102/58 | HR 80 | Ht 65.0 in | Wt 146.2 lb

## 2015-09-06 DIAGNOSIS — N809 Endometriosis, unspecified: Secondary | ICD-10-CM

## 2015-09-06 MED ORDER — LEUPROLIDE ACETATE 3.75 MG IM KIT
3.7500 mg | PACK | Freq: Once | INTRAMUSCULAR | Status: AC
  Administered 2015-09-06: 3.75 mg via INTRAMUSCULAR

## 2015-09-06 NOTE — Progress Notes (Signed)
Patient ID: Heather Duncan, female   DOB: August 30, 1996, 19 y.o.   MRN: [id number] Pt presents for 3rd Lupron injection. Pt states she had a period starting 08/26/15 which lasted 8 days. The first 2 days her period was heavy. Pain the first day but by the 2nd day it was better.

## 2015-10-04 ENCOUNTER — Ambulatory Visit: Payer: BLUE CROSS/BLUE SHIELD

## 2015-10-09 ENCOUNTER — Ambulatory Visit: Payer: BLUE CROSS/BLUE SHIELD

## 2015-10-14 ENCOUNTER — Ambulatory Visit (INDEPENDENT_AMBULATORY_CARE_PROVIDER_SITE_OTHER): Payer: BLUE CROSS/BLUE SHIELD

## 2015-10-14 VITALS — BP 118/78 | HR 86 | Wt 153.3 lb

## 2015-10-14 DIAGNOSIS — N809 Endometriosis, unspecified: Secondary | ICD-10-CM

## 2015-10-14 MED ORDER — LEUPROLIDE ACETATE 3.75 MG IM KIT
3.7500 mg | PACK | Freq: Once | INTRAMUSCULAR | Status: AC
  Administered 2015-10-14: 3.75 mg via INTRAMUSCULAR

## 2015-10-14 NOTE — Progress Notes (Signed)
Patient ID: Heather Duncan, female   DOB: [birth year], 20 y.o.   MRN: [id number] Pt presents for 4th Lupron injection. Pt states she has not had any vaginal bleeding, no hot flashes. Some abdominal cramping.

## 2015-10-31 ENCOUNTER — Ambulatory Visit (INDEPENDENT_AMBULATORY_CARE_PROVIDER_SITE_OTHER): Payer: BLUE CROSS/BLUE SHIELD | Admitting: Obstetrics and Gynecology

## 2015-10-31 ENCOUNTER — Encounter: Payer: Self-pay | Admitting: Obstetrics and Gynecology

## 2015-10-31 VITALS — BP 112/71 | HR 102 | Ht 65.0 in | Wt 153.8 lb

## 2015-10-31 DIAGNOSIS — A749 Chlamydial infection, unspecified: Secondary | ICD-10-CM

## 2015-10-31 DIAGNOSIS — R102 Pelvic and perineal pain: Secondary | ICD-10-CM | POA: Diagnosis not present

## 2015-10-31 DIAGNOSIS — N809 Endometriosis, unspecified: Secondary | ICD-10-CM | POA: Diagnosis not present

## 2015-10-31 NOTE — Progress Notes (Signed)
SUBJECTIVE Heather Duncan is a 20 year old G0P0 female with a history of endometriosis who presents to the office for her fifth Lupron injection and treatment of cure. She tested positive for chlamydia in June. The patient is doing well and has no complaints at this time.   OBJECTIVE: BP 112/71 mmHg  Pulse 102  Ht  (1.651 m)  Wt 153 lb 12.8 oz (69.763 kg)  BMI 25.59 kg/m2  LMP 08/22/2015 (Approximate) Abdomen: Soft, nontender Pelvic: Bimanual-Cervical motion tenderness 1/4; Uterus is mobile with 1/4 tenderness  ASSESSMENT 1. Positive chlamydia test 2. Endometriosis; status post Lupron therapy 4 doses  PLAN 1. Repeat NAAT for gonorrhea/chlamydia was done today 2. Return in one month for Lupron injection   A total of 15 minutes were spent face-to-face with the patient during this encounter and over half of that time dealt with counseling and coordination of care.   Randa Lynn PA-S Herold Harms, MD   I have seen, interviewed, and examined the patient in conjunction with the [facility].A. student and affirm the diagnosis and management plan. Mandrell Vangilder A. Aisia Correira, MD, FACOG   Note: This dictation was prepared with Dragon dictation along with smaller phrase technology. Any transcriptional errors that result from this process are unintentional.

## 2015-11-02 LAB — GC/CHLAMYDIA PROBE AMP
CHLAMYDIA, DNA PROBE: NEGATIVE
NEISSERIA GONORRHOEAE BY PCR: NEGATIVE

## 2015-11-03 NOTE — Patient Instructions (Signed)
1.  Chlamydia test is obtained today. 2.  Lupron injection #5 is given today. 3.  Return in 4 weeks for her last Lupron injection

## 2015-11-12 ENCOUNTER — Ambulatory Visit: Payer: BLUE CROSS/BLUE SHIELD | Admitting: Obstetrics and Gynecology

## 2015-11-14 ENCOUNTER — Telehealth: Payer: Self-pay

## 2015-11-14 ENCOUNTER — Ambulatory Visit: Payer: BLUE CROSS/BLUE SHIELD

## 2015-11-14 NOTE — Telephone Encounter (Signed)
Pt's Lupron injection has not arrived in office. Unable to reach pt, voice mail box full.

## 2015-11-15 ENCOUNTER — Telehealth: Payer: Self-pay

## 2015-11-15 NOTE — Telephone Encounter (Signed)
Lupron injection came today, vmb full again.

## 2015-11-19 ENCOUNTER — Ambulatory Visit: Payer: BLUE CROSS/BLUE SHIELD

## 2015-11-19 ENCOUNTER — Ambulatory Visit (INDEPENDENT_AMBULATORY_CARE_PROVIDER_SITE_OTHER): Payer: BLUE CROSS/BLUE SHIELD

## 2015-11-19 VITALS — BP 101/69 | HR 81 | Wt 152.3 lb

## 2015-11-19 DIAGNOSIS — N809 Endometriosis, unspecified: Secondary | ICD-10-CM

## 2015-11-19 MED ORDER — LEUPROLIDE ACETATE 3.75 MG IM KIT
3.7500 mg | PACK | Freq: Once | INTRAMUSCULAR | Status: AC
  Administered 2015-11-19: 3.75 mg via INTRAMUSCULAR

## 2015-11-19 NOTE — Telephone Encounter (Signed)
Pt came in today for Lupron injection.

## 2015-11-19 NOTE — Progress Notes (Signed)
Patient ID: Heather Duncan, female   DOB: 1996/06/18, 20 y.o.   MRN: [id number] Pt presents for 5th Lupron injection for pelvic pain/Endometriosis. Pt denies any pelvic pain or vaginal bleeding.

## 2015-12-17 ENCOUNTER — Ambulatory Visit: Payer: BLUE CROSS/BLUE SHIELD

## 2015-12-20 ENCOUNTER — Telehealth: Payer: Self-pay

## 2015-12-20 NOTE — Telephone Encounter (Signed)
Unable to leave message that Lupron injection is here. Voice mail box full.

## 2015-12-23 ENCOUNTER — Telehealth: Payer: Self-pay

## 2015-12-23 NOTE — Telephone Encounter (Signed)
Attempted to reach pt to let her know Lupron here but mailbox full.

## 2015-12-27 ENCOUNTER — Ambulatory Visit (INDEPENDENT_AMBULATORY_CARE_PROVIDER_SITE_OTHER): Payer: BLUE CROSS/BLUE SHIELD | Admitting: Obstetrics and Gynecology

## 2015-12-27 VITALS — BP 126/81 | HR 80 | Wt 152.1 lb

## 2015-12-27 DIAGNOSIS — N809 Endometriosis, unspecified: Secondary | ICD-10-CM

## 2015-12-27 MED ORDER — LEUPROLIDE ACETATE 3.75 MG IM KIT
3.7500 mg | PACK | Freq: Once | INTRAMUSCULAR | Status: AC
  Administered 2015-12-27: 3.75 mg via INTRAMUSCULAR

## 2015-12-27 NOTE — Addendum Note (Signed)
Addended by: Jackquline DenmarkIDGEWAY, Eleanore Junio W on: 12/27/2015 12:45 PM   Modules accepted: Orders, Level of Service

## 2015-12-27 NOTE — Progress Notes (Signed)
Patient ID: [name], female   DOB: 04-21-1996, 20 y.o.   MRN: [id number] Pt presents for 6th Lupron injection. No c/o side effects. To f/u with Dr. Greggory KeeneFrancesco in 2-4 wks.

## 2016-01-16 ENCOUNTER — Ambulatory Visit: Payer: BLUE CROSS/BLUE SHIELD | Admitting: Obstetrics and Gynecology

## 2016-02-12 ENCOUNTER — Ambulatory Visit: Payer: BLUE CROSS/BLUE SHIELD | Admitting: Obstetrics and Gynecology

## 2016-02-13 ENCOUNTER — Ambulatory Visit (INDEPENDENT_AMBULATORY_CARE_PROVIDER_SITE_OTHER): Payer: BLUE CROSS/BLUE SHIELD | Admitting: Obstetrics and Gynecology

## 2016-02-13 ENCOUNTER — Encounter: Payer: Self-pay | Admitting: Obstetrics and Gynecology

## 2016-02-13 VITALS — BP 109/65 | HR 96 | Ht 65.0 in | Wt 158.7 lb

## 2016-02-13 DIAGNOSIS — Z30011 Encounter for initial prescription of contraceptive pills: Secondary | ICD-10-CM

## 2016-02-13 DIAGNOSIS — N809 Endometriosis, unspecified: Secondary | ICD-10-CM

## 2016-02-13 DIAGNOSIS — N946 Dysmenorrhea, unspecified: Secondary | ICD-10-CM | POA: Diagnosis not present

## 2016-02-13 MED ORDER — LEVONORGEST-ETH ESTRAD 91-DAY 0.15-0.03 MG PO TABS: 90.0000 | ORAL_TABLET | Freq: Every day | ORAL | Status: DC

## 2016-02-13 MED ORDER — LEVONORGEST-ETH ESTRAD 91-DAY 0.15-0.03 MG PO TABS: 1.0000 | ORAL_TABLET | Freq: Every day | ORAL | Status: DC

## 2016-02-13 NOTE — Progress Notes (Signed)
Chief complaint: 1. Endometriosis  Patient presents for follow-up after 6 months of Depo-Lupron injections for management of symptomatic endometriosis. Over the past 6 months the patient has experienced minimal pelvic pain. She did not encounter any significant side effects. She is not desiring to follow up her Lupron therapy with continuous oral contraceptive therapy.  Past medical history, past surgical history, problem list, medications, and allergies are reviewed  OBJECTIVE: BP 109/65 mmHg  Pulse 96  Ht 5\' 5"  (1.651 m)  Wt 158 lb 11.2 oz (71.986 kg)  BMI 26.41 kg/m2 Pleasant white female in no acute distress Abdomen: Soft, nontender without organomegaly Pelvic exam: External genitalia normal BUS-normal Vagina-good vault support Cervix-no cervical motion tenderness Uterus-midplane, mobile, normal size and shape, nontender Adnexa-no palpable masses or tenderness Rectovaginal-normal external exam  ASSESSMENT: 1. Status post 6 months of Lupron therapy for symptomatic endometriosis; patient currently asymptomatic  PLAN: 1. Begin oral contraceptives 84/7 regimen 2. Monitor pain characteristics 3. Return in 3 months for follow-up  A total of 15 minutes were spent face-to-face with the patient during this encounter and over half of that time dealt with counseling and coordination of care.  Herold HarmsMartin A Defrancesco, MD  Note: This dictation was prepared with Dragon dictation along with smaller phrase technology. Any transcriptional errors that result from this process are unintentional.

## 2016-02-13 NOTE — Addendum Note (Signed)
Addended by: Marchelle FolksMILLER, Ragan Reale G on: 02/13/2016 03:05 PM   Modules accepted: Orders

## 2016-02-13 NOTE — Patient Instructions (Signed)
1. Begin oral contraceptives in an 84/7 regimen 2. Return in 3 months for follow-up

## 2016-02-14 ENCOUNTER — Other Ambulatory Visit: Payer: Self-pay

## 2016-02-14 MED ORDER — LEVONORGEST-ETH ESTRAD 91-DAY 0.15-0.03 MG PO TABS: 1.0000 | ORAL_TABLET | Freq: Every day | ORAL | Status: DC

## 2016-03-05 ENCOUNTER — Encounter: Payer: BLUE CROSS/BLUE SHIELD | Admitting: Obstetrics and Gynecology

## 2016-05-13 ENCOUNTER — Ambulatory Visit: Payer: BLUE CROSS/BLUE SHIELD | Admitting: Obstetrics and Gynecology

## 2016-05-19 ENCOUNTER — Ambulatory Visit (INDEPENDENT_AMBULATORY_CARE_PROVIDER_SITE_OTHER): Payer: BLUE CROSS/BLUE SHIELD | Admitting: Obstetrics and Gynecology

## 2016-05-19 ENCOUNTER — Encounter: Payer: Self-pay | Admitting: Obstetrics and Gynecology

## 2016-05-19 VITALS — BP 137/78 | HR 101 | Ht 65.0 in | Wt 181.8 lb

## 2016-05-19 DIAGNOSIS — N946 Dysmenorrhea, unspecified: Secondary | ICD-10-CM

## 2016-05-19 DIAGNOSIS — N809 Endometriosis, unspecified: Secondary | ICD-10-CM

## 2016-05-19 DIAGNOSIS — N920 Excessive and frequent menstruation with regular cycle: Secondary | ICD-10-CM

## 2016-05-19 NOTE — Patient Instructions (Signed)
1. Return in 6 months for annual exam 2. Continue with Seasonale oral contraceptive for cycle regulation and endometriosis management

## 2016-05-19 NOTE — Progress Notes (Signed)
Chief complaint: 1. Endometriosis 2. Contraceptive management 3. History of dysmenorrhea 4. History of menorrhagia  Patient presents for follow-up. She is status post 6 months of Depo-Lupron therapy; she has completed 3 months of Seasonale oral contraceptives. She is currently on pill free week and is waiting for her menses. She has not had any breakthrough bleeding. She has not had a recurrence of pelvic pain. She is not having any side effects from the birth control pill. Overall patient is very comfortable with management plan endometriosis at the present time.  OBJECTIVE: BP 137/78   Pulse (!) 101   Ht 5\' 5"  (1.651 m)   Wt 181 lb 12.8 oz (82.5 kg)   LMP  (LMP Unknown)   BMI 30.25 kg/m  Physical exam-deferred  ASSESSMENT: 1. Endometriosis, clinically stable on continuous oral contraceptive therapy 2. History of Depo-Lupron therapy 6 months  PLAN: 1. Continue Seasonale oral contraceptive 2. Return in 6 months for annual exam  A total of 15 minutes were spent face-to-face with the patient during this encounter and over half of that time dealt with counseling and coordination of care.  Herold HarmsMartin A Carlyann Placide, MD  Note: This dictation was prepared with Dragon dictation along with smaller phrase technology. Any transcriptional errors that result from this process are unintentional.

## 2016-06-13 IMAGING — CT CT ABD-PELV W/ CM
1 of 2 series · 15 of 32 positions shown, 19 images · IV contrast (omnipaque)
Comparison: Acute abdominal series [DATE] 11/10/2012

CLINICAL DATA: 18-year-old female with abdominal and pelvic pain
beginning last night at 10 p.m.

EXAM:
CT ABDOMEN AND PELVIS WITH CONTRAST
TECHNIQUE: Multidetector CT imaging of the abdomen and pelvis was performed
using the standard protocol following bolus administration of
intravenous contrast.
CONTRAST:  100mL OMNIPAQUE IOHEXOL 300 MG/ML  SOLN

[Series 2: routine abd pel with · axial · 0.62mm/px · z∈[-332,+58]mm · 15 of 86 slices shown, 19 images]
[im 4/86  soft-tissue]
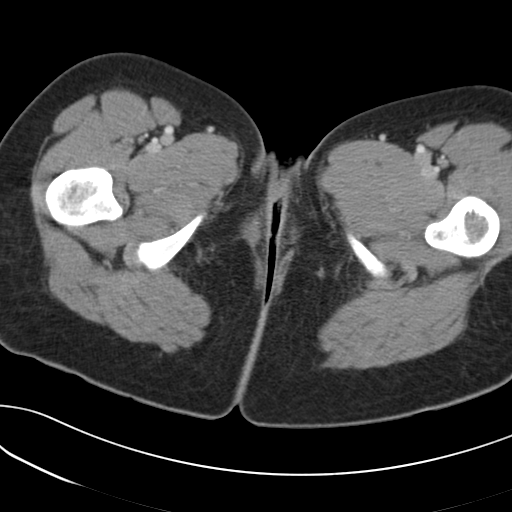
[im 4/86  bone]
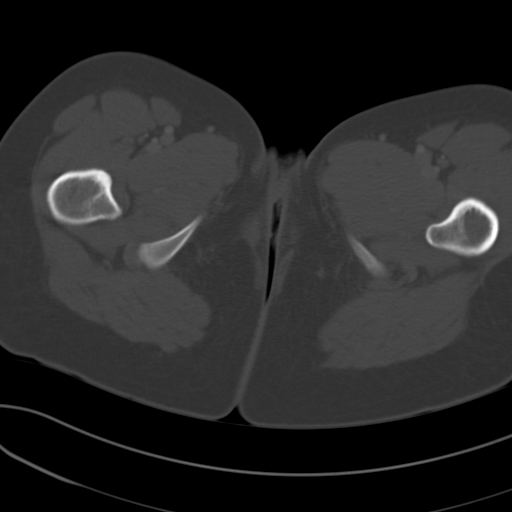
[im 11/86  soft-tissue]
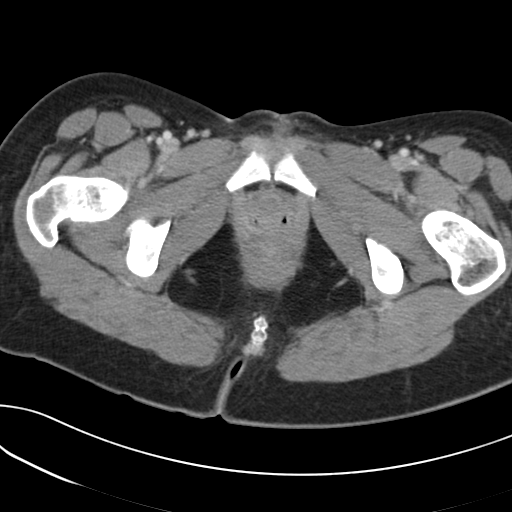
[im 18/86  soft-tissue]
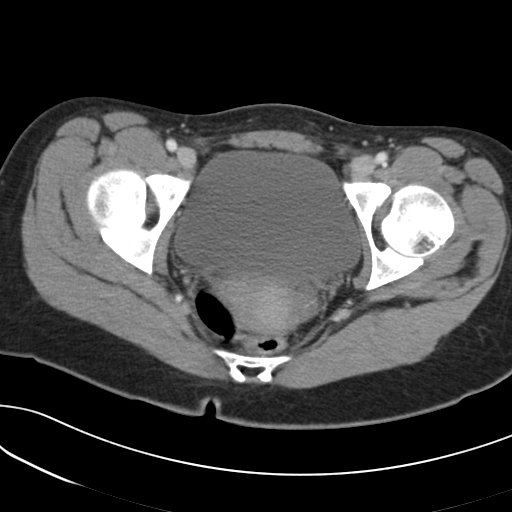
[im 25/86  soft-tissue]
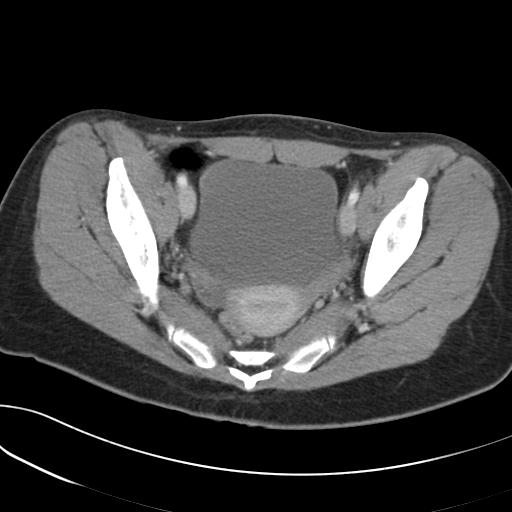
[im 29/86  soft-tissue]
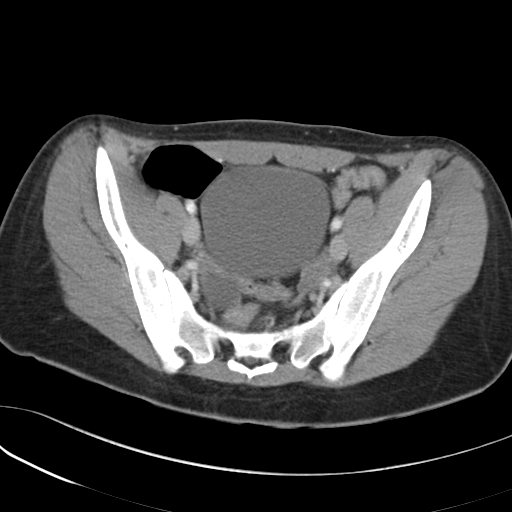
[im 36/86  soft-tissue]
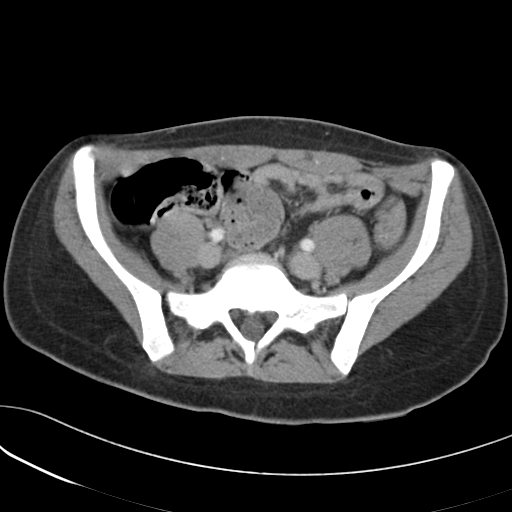
[im 43/86  soft-tissue]
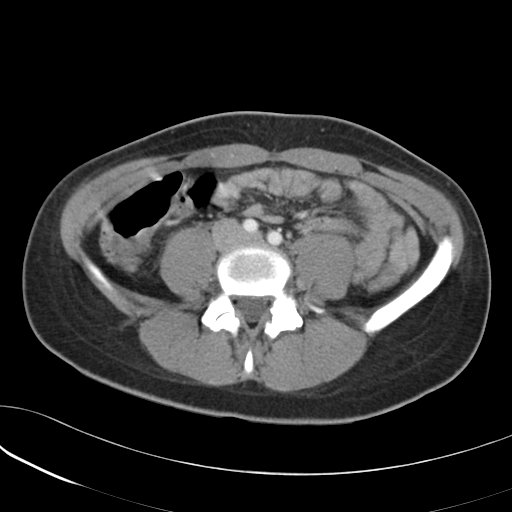
[im 50/86  soft-tissue]
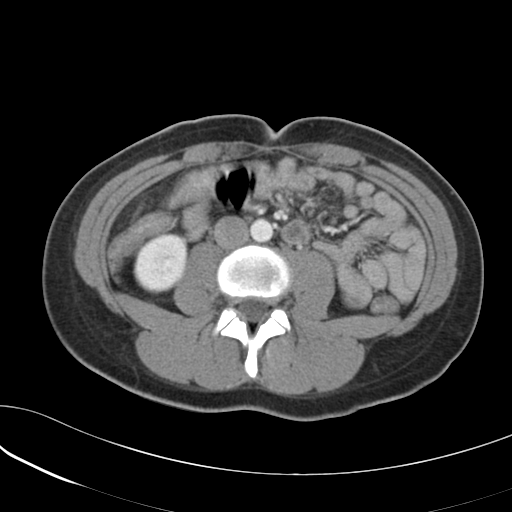
[im 57/86  soft-tissue]
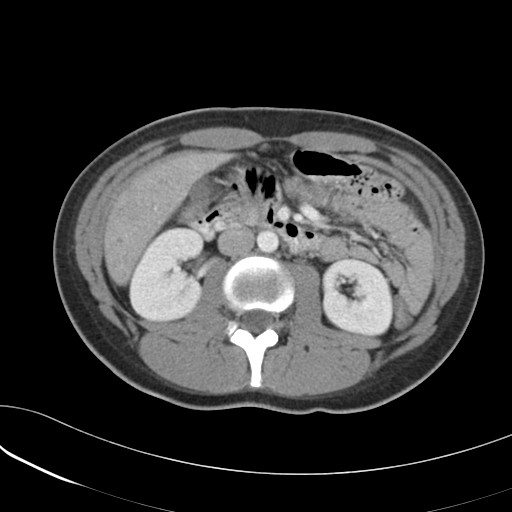
[im 57/86  bone]
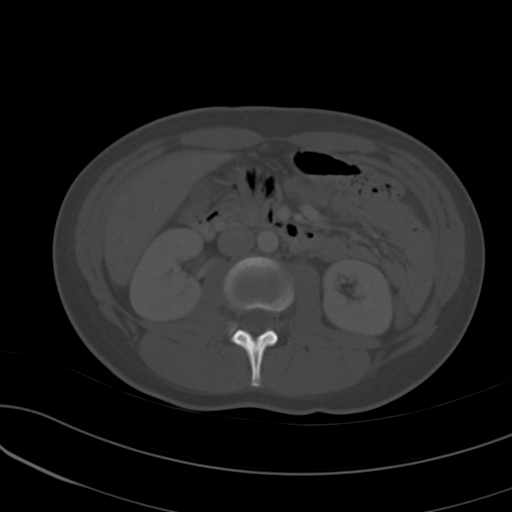
[im 61/86  soft-tissue]
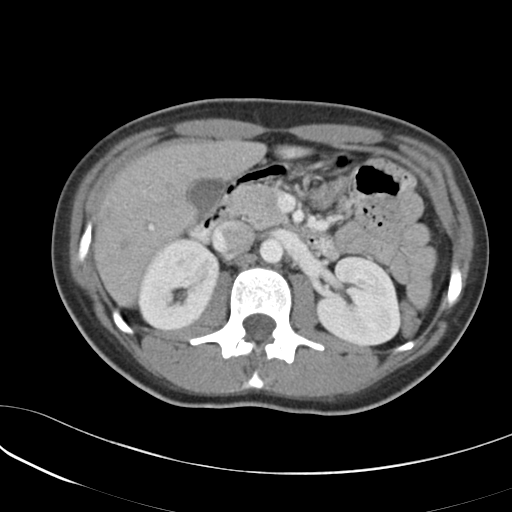
[im 68/86  soft-tissue]
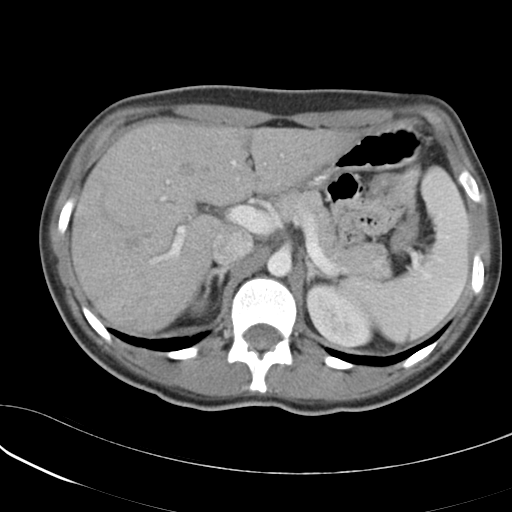
[im 71/86  lung]
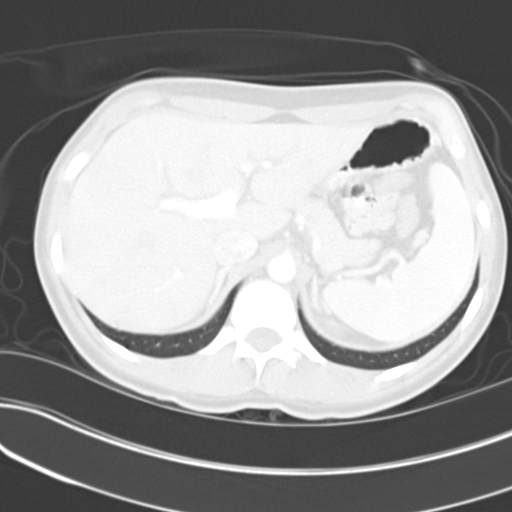
[im 75/86  soft-tissue]
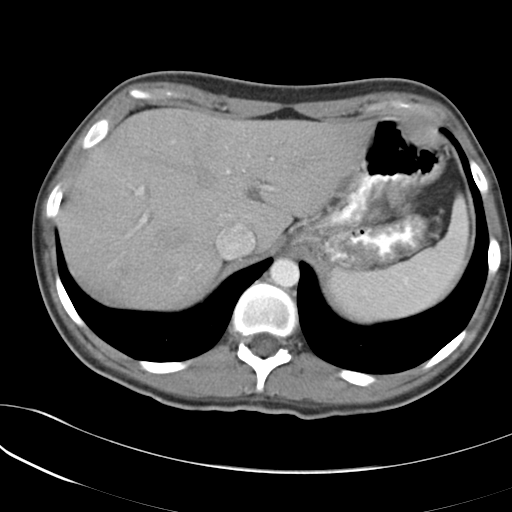
[im 75/86  lung]
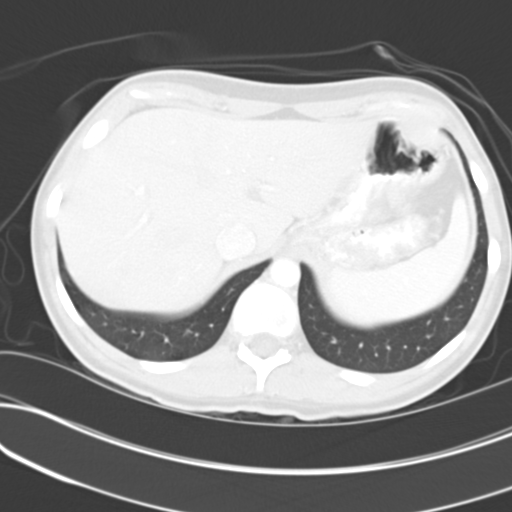
[im 78/86  lung]
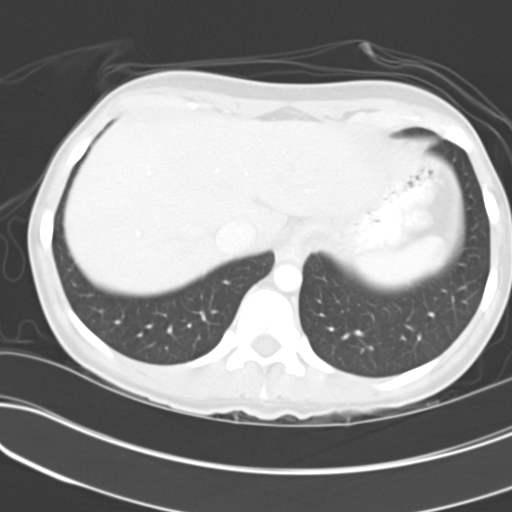
[im 82/86  soft-tissue]
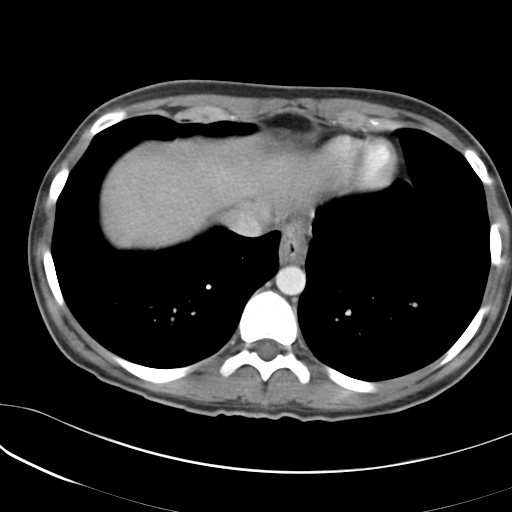
[im 82/86  lung]
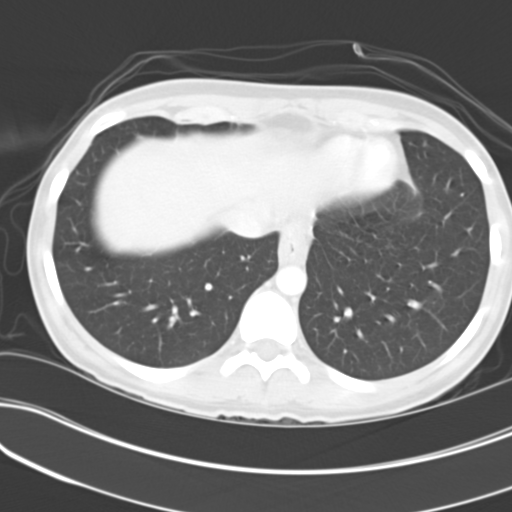

[15 of 32 positions shown; findings below may reference images not displayed]

FINDINGS: Lower Chest: The lung bases are clear. Visualized cardiac structures
are within normal limits for size. No pericardial effusion.
Unremarkable visualized distal thoracic esophagus.

Abdomen: Unremarkable CT appearance of the stomach, duodenum,
spleen, adrenal glands and pancreas. Normal hepatic contour and
morphology. No discrete hepatic lesion. Gallbladder is unremarkable.
No intra or extrahepatic biliary ductal dilatation.

Unremarkable appearance of the bilateral kidneys. No focal solid
lesion, hydronephrosis or nephrolithiasis. No evidence of
obstruction or focal bowel wall thickening. Normal appendix in the
right lower quadrant. The terminal ileum is unremarkable. No free
fluid or suspicious adenopathy.

Pelvis: Unremarkable bladder, uterus and adnexa. Trace free fluid is
likely physiologic. No suspicious adenopathy. 2.9 simple cyst
incidentally noted within the right ovary. This is a normal finding.

Bones/Soft Tissues: No acute fracture or aggressive appearing lytic
or blastic osseous lesion.

Vascular: No significant atherosclerotic vascular disease,
aneurysmal dilatation or acute abnormality.
IMPRESSION: 1. No acute abnormality in the abdomen or pelvis to explain the
patient's clinical symptoms.
2. Incidental note is made of a 2.9 cm simple right ovarian cyst.
Although a normal finding in a reproductive age female, this could
be a source of pain if the patient's pain localizes to the right
lower quadrant/right pelvis.

## 2016-11-11 ENCOUNTER — Encounter: Payer: Self-pay | Admitting: Obstetrics and Gynecology

## 2016-11-11 ENCOUNTER — Ambulatory Visit (INDEPENDENT_AMBULATORY_CARE_PROVIDER_SITE_OTHER): Payer: 59 | Admitting: Obstetrics and Gynecology

## 2016-11-11 VITALS — BP 105/69 | HR 92 | Ht 65.0 in | Wt 190.8 lb

## 2016-11-11 DIAGNOSIS — N920 Excessive and frequent menstruation with regular cycle: Secondary | ICD-10-CM | POA: Diagnosis not present

## 2016-11-11 DIAGNOSIS — Z3041 Encounter for surveillance of contraceptive pills: Secondary | ICD-10-CM | POA: Diagnosis not present

## 2016-11-11 DIAGNOSIS — Z01419 Encounter for gynecological examination (general) (routine) without abnormal findings: Secondary | ICD-10-CM

## 2016-11-11 DIAGNOSIS — N946 Dysmenorrhea, unspecified: Secondary | ICD-10-CM | POA: Diagnosis not present

## 2016-11-11 MED ORDER — LEVONORGEST-ETH ESTRAD 91-DAY 0.15-0.03 MG PO TABS: 1.0000 | ORAL_TABLET | Freq: Every day | ORAL | 4 refills | Status: DC

## 2016-11-11 NOTE — Progress Notes (Signed)
ANNUAL PREVENTATIVE CARE GYN  ENCOUNTER NOTE  Subjective:       Heather Duncan is a [age] y.[place]. G0P0000 female here for a routine annual gynecologic exam.  Current complaints: 1.  NONE    Gynecologic History Patient's last menstrual period was 11/02/2016 (approximate). Contraception: OCP (estrogen/progesterone) Last Pap: NEVER.  Last mammogram:na  Obstetric History OB History  Gravida Para Term Preterm AB Living  0 0 0 0 0 0  SAB TAB Ectopic Multiple Live Births  0 0 0 0          Past Medical History:  Diagnosis Date  . Anxiety   . Chronic kidney disease   . Depression   . GERD (gastroesophageal reflux disease)     Past Surgical History:  Procedure Laterality Date  . LAPAROSCOPY N/A 05/06/2015   Procedure: LAPAROSCOPY DIAGNOSTIC AND BIOPSIES;  Surgeon: Herold HarmsMartin A Lateef Juncaj, MD;  Location: [facility] ORS;  Service: Gynecology;  Laterality: N/A;  . WISDOM TOOTH EXTRACTION  2012    Current Outpatient Prescriptions on File Prior to Visit  Medication Sig Dispense Refill  . lamoTRIgine (LAMICTAL) 100 MG tablet Take 100 mg by mouth daily.  3  . levonorgestrel-ethinyl estradiol (SEASONALE,INTROVALE,JOLESSA) 0.15-0.03 MG tablet Take 1 tablet by mouth daily. Pt to take 84/7 regimen. 3 Package 4  . LORazepam (ATIVAN) 0.5 MG tablet Take 1 tablet (0.5 mg total) by mouth daily. 12 tablet 0  . omeprazole (PRILOSEC) 20 MG capsule Take 20 mg by mouth daily.    . traZODone (DESYREL) 100 MG tablet Take 100 mg by mouth daily as needed.  3   No current facility-administered medications on file prior to visit.     No Known Allergies  Social History   Social History  . Marital status: Single    Spouse name: N/A  . Number of children: N/A  . Years of education: N/A   Occupational History  . Not on file.   Social History Main Topics  . Smoking status: Current Some Day Smoker    Packs/day: 0.50  . Smokeless tobacco: Never Used  . Alcohol use Yes     Comment: occasionally  . Drug use: Yes     Frequency: 7.0 times per week    Types: Marijuana     Comment: OCCAS  . Sexual activity: Yes    Birth control/ protection: Pill   Other Topics Concern  . Not on file   Social History Narrative  . No narrative on file    Family History  Problem Relation Age of Onset  . Hypertension Father   . Breast cancer Neg Hx   . Ovarian cancer Neg Hx   . Diabetes Neg Hx   . Heart disease Neg Hx     The following portions of the patient's history were reviewed and updated as appropriate: allergies, current medications, past family history, past medical history, past social history, past surgical history and problem list.  Review of Systems Review of Systems  Constitutional: Negative.   HENT: Negative.   Eyes: Negative.   Respiratory: Negative.   Cardiovascular: Negative.   Gastrointestinal: Negative.   Genitourinary: Negative.        Menses every 3 months on 84/7 OCP regimen Duration of flow-7 days, moderate Dysmenorrhea improved on OCPs  Skin: Negative.     Objective:   BP 105/69   Pulse 92   Ht 5\' 5"  (1.651 m)   Wt 190 lb 12.8 oz (86.5 kg)   LMP 11/02/2016 (Approximate)   BMI  31.75 kg/m  CONSTITUTIONAL: Well-developed, well-nourished female in no acute distress.  PSYCHIATRIC: Normal mood and affect. Normal behavior. Normal judgment and thought content. NEUROLGIC: Alert and oriented to person, place, and time. Normal muscle tone coordination. No cranial nerve deficit noted. HENT:  Normocephalic, atraumatic, External right and left ear normal. EYES: Conjunctivae and EOM are normal.  No scleral icterus.  NECK: Normal range of motion, supple, no masses.  Normal thyroid.  SKIN: Skin is warm and dry. No rash noted. Not diaphoretic. No erythema. No pallor. CARDIOVASCULAR: Normal heart rate noted, regular rhythm, no murmur. RESPIRATORY: Clear to auscultation bilaterally. Effort and breath sounds normal, no problems with respiration noted. BREASTS: Symmetric in size. No  masses, skin changes, nipple drainage, or lymphadenopathy. ABDOMEN: Soft, normal bowel sounds, no distention noted.  No tenderness, rebound or guarding.  BLADDER: Normal PELVIC:  External Genitalia: Normal  BUS: Normal  Vagina: Normal  Cervix: Normal; minimal bloody mucus at os cleaned with [place] Q-tip; no cervical motion tenderness  Uterus: Normal; midplane, mobile, nontender, normal size and shape  Adnexa: Normal; nonpalpable nontender  RV: External Exam NormaI  MUSCULOSKELETAL: Normal range of motion. No tenderness.  No cyanosis, clubbing, or edema.  2+ distal pulses. LYMPHATIC: No Axillary, Supraclavicular, or Inguinal Adenopathy.    Assessment:   Annual gynecologic examination 21 y.o. Contraception: OCP (estrogen/progesterone) BMI-31  History of alcohol abuse, substance abuse History of dysmenorrhea and menorrhagia, controlled with birth control pills   Plan:  Pap: Pap, Reflex if ASCUS and GC/CT NAAT Mammogram: Not Indicated Stool Guaiac Test LABS: THRU PCP Routine preventative health maintenance measures emphasized: Exercise/Diet/Weight control, Tobacco Warnings, Alcohol/Substance use risks and Safe Sex Birth control pills refilled. Encouraged 10- 12 pound weight loss in 1 year Return to Clinic - 1 [id number] [place], CMA  Herold Harms, MD

## 2016-11-11 NOTE — Patient Instructions (Signed)
1. Pap smear is performed 2. STD testing is completed 3. Healthy eating and exercise is encouraged with a controlled weight loss of 10 pounds in 1 year ago 4. Birth control pills are refilled 5. Safe sex practices encouraged 6. Return in 1 year for annual exam   Health Maintenance, Female Introduction Adopting a healthy lifestyle and getting preventive care can go a long way to promote health and wellness. Talk with your health care provider about what schedule of regular examinations is right for you. This is a good chance for you to check in with your provider about disease prevention and staying healthy. In between checkups, there are plenty of things you can do on your own. Experts have done a lot of research about which lifestyle changes and preventive measures are most likely to keep you healthy. Ask your health care provider for more information. Weight and diet Eat a healthy diet  Be sure to include plenty of vegetables, fruits, low-fat dairy products, and lean protein.  Do not eat a lot of foods high in solid fats, added sugars, or salt.  Get regular exercise. This is one of the most important things you can do for your health.  Most adults should exercise for at least 150 minutes each week. The exercise should increase your heart rate and make you sweat (moderate-intensity exercise).  Most adults should also do strengthening exercises at least twice a week. This is in addition to the moderate-intensity exercise. Maintain a healthy weight  Body mass index (BMI) is a measurement that can be used to identify possible weight problems. It estimates body fat based on height and weight. Your health care provider can help determine your BMI and help you achieve or maintain a healthy weight.  For females 74 years of age and older:  A BMI below 18.5 is considered underweight.  A BMI of 18.5 to 24.9 is normal.  A BMI of 25 to 29.9 is considered overweight.  A BMI of 30 and above is  considered obese. Watch levels of cholesterol and blood lipids  You should start having your blood tested for lipids and cholesterol at 22 years of age, then have this test every 5 years.  You may need to have your cholesterol levels checked more often if:  Your lipid or cholesterol levels are high.  You are older than 22 years of age.  You are at high risk for heart disease. Cancer screening Lung Cancer  Lung cancer screening is recommended for adults 38-20 years old who are at high risk for lung cancer because of a history of smoking.  A yearly low-dose CT scan of the lungs is recommended for people who:  Currently smoke.  Have quit within the past 15 years.  Have at least a 30-pack-year history of smoking. A pack year is smoking an average of one pack of cigarettes a day for 1 year.  Yearly screening should continue until it has been 15 years since you quit.  Yearly screening should stop if you develop a health problem that would prevent you from having lung cancer treatment. Breast Cancer  Practice breast self-awareness. This means understanding how your breasts normally appear and feel.  It also means doing regular breast self-exams. Let your health care provider know about any changes, no matter how small.  If you are in your 20s or 30s, you should have a clinical breast exam (CBE) by a health care provider every 1-3 years as part of a regular health exam.  If you are 40 or older, have a CBE every year. Also consider having a breast X-ray (mammogram) every year.  If you have a family history of breast cancer, talk to your health care provider about genetic screening.  If you are at high risk for breast cancer, talk to your health care provider about having an MRI and a mammogram every year.  Breast cancer gene (BRCA) assessment is recommended for women who have family members with BRCA-related cancers. BRCA-related cancers  include:  Breast.  Ovarian.  Tubal.  Peritoneal cancers.  Results of the assessment will determine the need for genetic counseling and BRCA1 and BRCA2 testing. Cervical Cancer  Your health care provider may recommend that you be screened regularly for cancer of the pelvic organs (ovaries, uterus, and vagina). This screening involves a pelvic examination, including checking for microscopic changes to the surface of your cervix (Pap test). You may be encouraged to have this screening done every 3 years, beginning at age 20.  For women ages 9-65, health care providers may recommend pelvic exams and Pap testing every 3 years, or they may recommend the Pap and pelvic exam, combined with testing for human papilloma virus (HPV), every 5 years. Some types of HPV increase your risk of cervical cancer. Testing for HPV may also be done on women of any age with unclear Pap test results.  Other health care providers may not recommend any screening for nonpregnant women who are considered low risk for pelvic cancer and who do not have symptoms. Ask your health care provider if a screening pelvic exam is right for you.  If you have had past treatment for cervical cancer or a condition that could lead to cancer, you need Pap tests and screening for cancer for at least 20 years after your treatment. If Pap tests have been discontinued, your risk factors (such as having a new sexual partner) need to be reassessed to determine if screening should resume. Some women have medical problems that increase the chance of getting cervical cancer. In these cases, your health care provider may recommend more frequent screening and Pap tests. Colorectal Cancer  This type of cancer can be detected and often prevented.  Routine colorectal cancer screening usually begins at 22 years of age and continues through 22 years of age.  Your health care provider may recommend screening at an earlier age if you have risk factors  for colon cancer.  Your health care provider may also recommend using home test kits to check for hidden blood in the stool.  A small camera at the end of a tube can be used to examine your colon directly (sigmoidoscopy or colonoscopy). This is done to check for the earliest forms of colorectal cancer.  Routine screening usually begins at age 88.  Direct examination of the colon should be repeated every 5-10 years through 22 years of age. However, you may need to be screened more often if early forms of precancerous polyps or small growths are found. Skin Cancer  Check your skin from head to toe regularly.  Tell your health care provider about any new moles or changes in moles, especially if there is a change in a mole's shape or color.  Also tell your health care provider if you have a mole that is larger than the size of a pencil eraser.  Always use sunscreen. Apply sunscreen liberally and repeatedly throughout the day.  Protect yourself by wearing long sleeves, pants, a wide-brimmed hat, and sunglasses whenever  you are outside. Heart disease, diabetes, and high blood pressure  High blood pressure causes heart disease and increases the risk of stroke. High blood pressure is more likely to develop in:  People who have blood pressure in the high end of the normal range (130-139/85-89 mm Hg).  People who are overweight or obese.  People who are African American.  If you are 56-57 years of age, have your blood pressure checked every 3-5 years. If you are 70 years of age or older, have your blood pressure checked every year. You should have your blood pressure measured twice-once when you are at a hospital or clinic, and once when you are not at a hospital or clinic. Record the average of the two measurements. To check your blood pressure when you are not at a hospital or clinic, you can use:  An automated blood pressure machine at a pharmacy.  A home blood pressure monitor.  If you  are between 70 years and 27 years old, ask your health care provider if you should take aspirin to prevent strokes.  Have regular diabetes screenings. This involves taking a blood sample to check your fasting blood sugar level.  If you are at a normal weight and have a low risk for diabetes, have this test once every three years after 22 years of age.  If you are overweight and have a high risk for diabetes, consider being tested at a younger age or more often. Preventing infection Hepatitis B  If you have a higher risk for hepatitis B, you should be screened for this virus. You are considered at high risk for hepatitis B if:  You were born in a country where hepatitis B is common. Ask your health care provider which countries are considered high risk.  Your parents were born in a high-risk country, and you have not been immunized against hepatitis B (hepatitis B vaccine).  You have HIV or AIDS.  You use needles to inject street drugs.  You live with someone who has hepatitis B.  You have had sex with someone who has hepatitis B.  You get hemodialysis treatment.  You take certain medicines for conditions, including cancer, organ transplantation, and autoimmune conditions. Hepatitis C  Blood testing is recommended for:  Everyone born from [birth year] through 1966.  Anyone with known risk factors for hepatitis C. Sexually transmitted infections (STIs)  You should be screened for sexually transmitted infections (STIs) including gonorrhea and chlamydia if:  You are sexually active and are younger than 22 years of age.  You are older than 22 years of age and your health care provider tells you that you are at risk for this type of infection.  Your sexual activity has changed since you were last screened and you are at an increased risk for chlamydia or gonorrhea. Ask your health care provider if you are at risk.  If you do not have HIV, but are at risk, it may be recommended that you  take a prescription medicine daily to prevent HIV infection. This is called pre-exposure prophylaxis (PrEP). You are considered at risk if:  You are sexually active and do not regularly use condoms or know the HIV status of your partner(s).  You take drugs by injection.  You are sexually active with a partner who has HIV. Talk with your health care provider about whether you are at high risk of being infected with HIV. If you choose to begin PrEP, you should first be tested for HIV.  You should then be tested every 3 months for as long as you are taking PrEP. Pregnancy  If you are premenopausal and you may become pregnant, ask your health care provider about preconception counseling.  If you may become pregnant, take 400 to 800 micrograms (mcg) of folic acid every day.  If you want to prevent pregnancy, talk to your health care provider about birth control (contraception). Osteoporosis and menopause  Osteoporosis is a disease in which the bones lose minerals and strength with aging. This can result in serious bone fractures. Your risk for osteoporosis can be identified using a bone density scan.  If you are 62 years of age or older, or if you are at risk for osteoporosis and fractures, ask your health care provider if you should be screened.  Ask your health care provider whether you should take a calcium or vitamin D supplement to lower your risk for osteoporosis.  Menopause may have certain physical symptoms and risks.  Hormone replacement therapy may reduce some of these symptoms and risks. Talk to your health care provider about whether hormone replacement therapy is right for you. Follow these instructions at home:  Schedule regular health, dental, and eye exams.  Stay current with your immunizations.  Do not use any tobacco products including cigarettes, chewing tobacco, or electronic cigarettes.  If you are pregnant, do not drink alcohol.  If you are breastfeeding, limit  how much and how often you drink alcohol.  Limit alcohol intake to no more than 1 drink per day for nonpregnant women. One drink equals 12 ounces of beer, 5 ounces of wine, or 1 ounces of hard liquor.  Do not use street drugs.  Do not share needles.  Ask your health care provider for help if you need support or information about quitting drugs.  Tell your health care provider if you often feel depressed.  Tell your health care provider if you have ever been abused or do not feel safe at home. This information is not intended to replace advice given to you by your health care provider. Make sure you discuss any questions you have with your health care provider. Document Released: 03/23/2011 Document Revised: 02/13/2016 Document Reviewed: 06/11/2015  2017 [employer]

## 2016-11-13 LAB — PAP IG, CT-NG, RFX HPV ASCU
CHLAMYDIA, NUC. ACID AMP: NEGATIVE
Gonococcus by Nucleic Acid Amp: NEGATIVE
PAP Smear Comment: 0

## 2016-12-02 ENCOUNTER — Ambulatory Visit (INDEPENDENT_AMBULATORY_CARE_PROVIDER_SITE_OTHER): Payer: 59 | Admitting: Obstetrics and Gynecology

## 2016-12-02 ENCOUNTER — Encounter: Payer: Self-pay | Admitting: Obstetrics and Gynecology

## 2016-12-02 VITALS — BP 122/70 | HR 112 | Ht 65.0 in | Wt 188.5 lb

## 2016-12-02 DIAGNOSIS — N921 Excessive and frequent menstruation with irregular cycle: Secondary | ICD-10-CM

## 2016-12-02 DIAGNOSIS — N939 Abnormal uterine and vaginal bleeding, unspecified: Secondary | ICD-10-CM | POA: Diagnosis not present

## 2016-12-02 NOTE — Progress Notes (Signed)
GYN ENCOUNTER NOTE  Subjective:       Heather Duncan is a 21 y.o. G0P0000 female is here for gynecologic evaluation of the following issues:   1. History of Dysmenorrhea, controlled with OCP.  2. Menorrhagia X 1 mo with passage of endometrial clots.    Gynecologic History Patient's last menstrual period was 11/02/2016 (approximate). Contraception: coitus interruptus, condoms and Seasonale  Last Pap: 11/13/2016. Results were: Normal Last mammogram: NA. Results were: NA  Obstetric History OB History  Gravida Para Term Preterm AB Living  0 0 0 0 0 0  SAB TAB Ectopic Multiple Live Births  0 0 0 0          Past Medical History:  Diagnosis Date  . Anxiety   . Chronic kidney disease   . Depression   . GERD (gastroesophageal reflux disease)     Past Surgical History:  Procedure Laterality Date  . LAPAROSCOPY N/A 05/06/2015   Procedure: LAPAROSCOPY DIAGNOSTIC AND BIOPSIES;  Surgeon: Herold HarmsMartin A Sehaj Kolden, MD;  Location: [facility] ORS;  Service: Gynecology;  Laterality: N/A;  . WISDOM TOOTH EXTRACTION  2012    Current Outpatient Prescriptions on File Prior to Visit  Medication Sig Dispense Refill  . lamoTRIgine (LAMICTAL) 100 MG tablet Take 100 mg by mouth daily.  3  . levonorgestrel-ethinyl estradiol (SEASONALE,INTROVALE,JOLESSA) 0.15-0.03 MG tablet Take 1 tablet by mouth daily. Pt to take 84/7 regimen. 3 Package 4  . LORazepam (ATIVAN) 0.5 MG tablet Take 1 tablet (0.5 mg total) by mouth daily. 12 tablet 0  . omeprazole (PRILOSEC) 20 MG capsule Take 20 mg by mouth daily.    . traZODone (DESYREL) 100 MG tablet Take 100 mg by mouth daily as needed.  3   No current facility-administered medications on file prior to visit.     No Known Allergies  Social History   Social History  . Marital status: Single    Spouse name: N/A  . Number of children: N/A  . Years of education: N/A   Occupational History  . Not on file.   Social History Main Topics  . Smoking status: Current Some  Day Smoker    Packs/day: 0.50  . Smokeless tobacco: Never Used  . Alcohol use Yes     Comment: occasionally  . Drug use: Yes    Frequency: 7.0 times per week    Types: Marijuana     Comment: OCCAS  . Sexual activity: Yes    Birth control/ protection: Pill   Other Topics Concern  . Not on file   Social History Narrative  . No narrative on file    Family History  Problem Relation Age of Onset  . Hypertension Father   . Breast cancer Neg Hx   . Ovarian cancer Neg Hx   . Diabetes Neg Hx   . Heart disease Neg Hx     The following portions of the patient's history were reviewed and updated as appropriate: allergies, current medications, past family history, past medical history, past social history, past surgical history and problem list.  Review of Systems  General: Positive for fatigue. Denies fever, and chills.  Heme: Denies bleeding problems, and easy bruising.   HEENT: Denies sinus congestion, odynia, and dysphagia. Cardiac: Denies chest pain. Denies palpitations and tachycardia.  Pulmonary: Denies SOB, and cough.    GI: Positive for catamenial nausea/vomiting, but states this is normal for her. Denies change in bowel habits.  GU: Positive for dysmenorrhea, and menorrhagia X 1 mo with passage  of uterine clots/Tissue mucosa.. Denies urgency incontinence. Denies increase in urine frequency, and dysuria. Denies pelvic pain.  MSK: Positive for mild menstrual cramps. Denies muscle weakness.  Neuro: Denies headache, dizziness and syncope. Denies orthostatic hypotensive episodes.   Objective:   BP 122/70   Pulse (!) 112   Ht 5\' 5"  (1.651 m)   Wt 188 lb 8 oz (85.5 kg)   LMP 11/02/2016 (Approximate)   BMI 31.37 kg/m  CONSTITUTIONAL: Well-developed, well-nourished female in no acute distress.  HEENT:  Normocephalic, atraumatic.  NECK: Normal thyroid, no palpable irregular masses at this time.  SKIN: Skin is warm and dry. No rash noted. Not diaphoretic. No erythema. No  pallor. No peripheral edema.  NEUROLGIC: AAOX3.  PSYCHIATRIC: Normal mood and affect. Normal behavior. Normal judgment and thought content. CARDIOVASCULAR: RRR with no m/r/g heard on auscultation.  RESPIRATORY: Lungs are CTAB with no audible crackles or wheezing.  BREASTS: Not Examined  ABDOMEN: Soft, non distended; Non tender.  No Organomegaly. PELVIC:  External Genitalia: Normal  BUS: Normal  Vagina: Normal  Cervix: Normal. Yellow  discharge present. No bleeding visualized on speculum exam; no cervical motion tenderness     Uterus: Normal size, shape,consistency, mobile, nontender   Adnexa: Normal; nonpalpable and nontender   RV: Normal  external exam   Bladder: Nontender MUSCULOSKELETAL: Normal range of motion. No tenderness.  No cyanosis, clubbing, or edema. Negative Homen's sign. No palpable cords in LE b/l.      Assessment:   1. Abnormal uterine bleeding (AUB) 2. Menorrhagia with irregular cycle  3. Passage of uterine clots - suspected decidual cast" Beats  4. Contraceptive Management - reminded to take pills consistently on schedule   5. Safe sex counseling     Plan:   1. CBC with differential / Platelet: r/o anemia secondary to sustained menorrhagia.  2. Start daily multivitamin that includes iron supplementation: to improve blood counts   3. Keep Menstrual Cycle log: track irregularity  4. Continue Seasonale as written 5. US Pelvis Complete and Transvaginal r/o polyps and or malignancy.    A total of 15 minutes were spent face-to-face with the patient during this encounter and over half of that time dealt with counseling and coordination of care.  Rose Clousing PA-S [employer]  Herold Harms, MD   I have seen, interviewed, and examined the patient in conjunction with the [facility].A. student and affirm the diagnosis and management plan. Remijio Holleran A. Tilda Samudio, MD, FACOG   Note: This dictation was prepared with Dragon dictation along with  smaller phrase technology. Any transcriptional errors that result from this process are unintentional.

## 2016-12-02 NOTE — Patient Instructions (Signed)
1. Continue taking Seasonale oral contraceptive 2. Maintain menstrual calendar monitoring 3. Start taking multivitamin with iron daily 4. Pelvic ultrasound is scheduled to assess for abnormal uterine findings that might be causing irregular bleeding 5. Return in 3 months for follow-up 6. CBC is to be obtained today to assess for anemia

## 2016-12-03 ENCOUNTER — Ambulatory Visit (INDEPENDENT_AMBULATORY_CARE_PROVIDER_SITE_OTHER): Payer: 59

## 2016-12-03 DIAGNOSIS — N921 Excessive and frequent menstruation with irregular cycle: Secondary | ICD-10-CM

## 2016-12-03 LAB — CBC WITH DIFFERENTIAL/PLATELET
BASOS ABS: 0 10*3/uL (ref 0.0–0.2)
Basos: 0 %
EOS (ABSOLUTE): 0.2 10*3/uL (ref 0.0–0.4)
Eos: 4 %
Hematocrit: 39.5 % (ref 34.0–46.6)
Hemoglobin: 12.8 g/dL (ref 11.1–15.9)
Immature Grans (Abs): 0 10*3/uL (ref 0.0–0.1)
Immature Granulocytes: 0 %
LYMPHS ABS: 2.8 10*3/uL (ref 0.7–3.1)
Lymphs: 45 %
MCH: 29.4 pg (ref 26.6–33.0)
MCHC: 32.4 g/dL (ref 31.5–35.7)
MCV: 91 fL (ref 79–97)
MONOCYTES: 6 %
Monocytes Absolute: 0.4 10*3/uL (ref 0.1–0.9)
Neutrophils Absolute: 2.8 10*3/uL (ref 1.4–7.0)
Neutrophils: 45 %
Platelets: 265 10*3/uL (ref 150–379)
RBC: 4.36 x10E6/uL (ref 3.77–5.28)
RDW: 13.2 % (ref 12.3–15.4)
WBC: 6.2 10*3/uL (ref 3.4–10.8)

## 2017-03-04 ENCOUNTER — Encounter: Payer: 59 | Admitting: Obstetrics and Gynecology

## 2017-03-17 ENCOUNTER — Encounter: Payer: 59 | Admitting: Obstetrics and Gynecology

## 2017-03-31 ENCOUNTER — Encounter: Payer: 59 | Admitting: Obstetrics and Gynecology

## 2017-04-08 ENCOUNTER — Encounter: Payer: Self-pay | Admitting: Obstetrics and Gynecology

## 2017-04-08 ENCOUNTER — Ambulatory Visit (INDEPENDENT_AMBULATORY_CARE_PROVIDER_SITE_OTHER): Payer: 59 | Admitting: Obstetrics and Gynecology

## 2017-04-08 VITALS — BP 110/73 | HR 96 | Ht 65.0 in | Wt 193.0 lb

## 2017-04-08 DIAGNOSIS — Z202 Contact with and (suspected) exposure to infections with a predominantly sexual mode of transmission: Secondary | ICD-10-CM

## 2017-04-08 DIAGNOSIS — N809 Endometriosis, unspecified: Secondary | ICD-10-CM | POA: Diagnosis not present

## 2017-04-08 DIAGNOSIS — N939 Abnormal uterine and vaginal bleeding, unspecified: Secondary | ICD-10-CM | POA: Diagnosis not present

## 2017-04-08 DIAGNOSIS — N898 Other specified noninflammatory disorders of vagina: Secondary | ICD-10-CM | POA: Diagnosis not present

## 2017-04-08 DIAGNOSIS — N921 Excessive and frequent menstruation with irregular cycle: Secondary | ICD-10-CM | POA: Diagnosis not present

## 2017-04-08 DIAGNOSIS — N946 Dysmenorrhea, unspecified: Secondary | ICD-10-CM | POA: Diagnosis not present

## 2017-04-08 NOTE — Progress Notes (Signed)
Chief complaint: 1. Symptomatic endometriosis 2. Abnormal uterine bleeding on OCPs 3. Vaginal discharge  21 year old nulliparous female on Seasonale oral contraceptive, previously seen because of menorrhagia for 1 month while on birth control pills, presents for follow-up over 3 months of monitoring. Previous CBC demonstrated no anemia. Patient is taking a multivitamin with iron. Previous ultrasound was normal with a non-thickened endometrium. Patient has continued to take Seasonale oral contraceptive and her abnormal uterine bleeding has resolved.  Significant issue today is some vulvar itching with vaginal discharge. She has had a new sexual partner. She denies fevers chills or sweats. She denies UTI symptoms.  Past medical history, past surgical history, problem list, medications, and allergies are reviewed  OBJECTIVE: BP 110/73   Pulse 96   Ht 5\' 5"  (1.651 m)   Wt 193 lb (87.5 kg)   LMP 03/16/2017 (Approximate)   BMI 32.12 kg/m  Pleasant female in no acute distress. Alert and oriented. Abdomen: Soft, nontender Pelvic exam: External genitalia-normal BUS-normal Vagina-thin gray-white secretions in vaginal vault Cervix-no lesions; no significant discharge Uterus-midplane, normal size and shape, mobile, nontender Adnexa-nonpalpable nontender Rectovaginal-normal external exam  PROCEDURE: Wet prep KOH-negative yeast Normal saline-TNTC white blood cells; no Trichomonas; no clue cells  ASSESSMENT: 1. Endometriosis, stable 2. Dysmenorrhea, controlled with birth control pills 3. History of abnormal uterine bleeding, now resolved 4. History of recent normal pelvic ultrasound 5. New partner; vaginal discharge with vulvar itching 6. Wet prep notable for TNTC white blood cells  PLAN: 1. OTC Monistat for empiric treatment of yeast infection 2. Nu swab plus is obtained 3. STD screen obtained today 4. Continue with Seasonale oral contraceptives 5. Follow-up in 6 months. Lab results  will be made available to the patient.  A total of 15 minutes were spent face-to-face with the patient during this encounter and over half of that time dealt with counseling and coordination of care.  Herold HarmsMartin A Tre Sanker, MD  Note: This dictation was prepared with Dragon dictation along with smaller phrase technology. Any transcriptional errors that result from this process are unintentional.

## 2017-04-08 NOTE — Patient Instructions (Signed)
1. Wet prep does not show yeast infection today. There is inflammation suggesting possible other infection 2. Nu swab culture will help us to determine the cause of vaginitis 3. May use over-the-counter Monistat to empirically treat for yeast 4. Results from testing will be made available to U 5. STD screen, comprehensive is obtained today 6. Return in 6 months for follow-up

## 2017-04-09 LAB — HEPATITIS C ANTIBODY

## 2017-04-09 LAB — HIV ANTIBODY (ROUTINE TESTING W REFLEX): HIV Screen 4th Generation wRfx: NONREACTIVE

## 2017-04-09 LAB — HSV(HERPES SIMPLEX VRS) I + II AB-IGG
HSV 1 Glycoprotein G Ab, IgG: <0.91 index (ref 0.00–0.90)
HSV 2 Glycoprotein G Ab, IgG: <0.91 index (ref 0.00–0.90)

## 2017-04-09 LAB — HEPATITIS B SURFACE ANTIGEN: Hepatitis B Surface Ag: NEGATIVE

## 2017-04-09 LAB — RPR: RPR: NONREACTIVE

## 2017-04-21 LAB — NUSWAB VAGINITIS PLUS (VG+)
ATOPOBIUM VAGINAE: HIGH {score} — AB
BVAB 2: HIGH Score — AB
Candida albicans, NAA: NEGATIVE
Candida glabrata, NAA: NEGATIVE
Chlamydia trachomatis, NAA: POSITIVE — AB
Megasphaera 1: HIGH Score — AB
NEISSERIA GONORRHOEAE, NAA: NEGATIVE
Trich vag by NAA: NEGATIVE

## 2017-04-27 ENCOUNTER — Telehealth: Payer: Self-pay | Admitting: Obstetrics and Gynecology

## 2017-04-27 NOTE — Telephone Encounter (Signed)
[facility] that [facility] is positive for [facility]lam[facility] and she hasn't heard from our office about treatment   Please call

## 2017-04-28 NOTE — Telephone Encounter (Signed)
LMTRC

## 2017-04-29 MED ORDER — METRONIDAZOLE 500 MG PO TABS: 500.0000 mg | ORAL_TABLET | Freq: Two times a day (BID) | ORAL | 0 refills | Status: DC

## 2017-04-29 MED ORDER — AZITHROMYCIN 1 G PO PACK: 1.0000 g | PACK | Freq: Once | ORAL | 0 refills | Status: AC

## 2017-04-29 NOTE — Telephone Encounter (Signed)
Pt aware of pos chlamydia and bv. [facility]ax [facility]. Epic down when I spoke with pt.. Meds phoned in. Pt aware to have toc in 4 weeks. She will call to schedule.  NO sex x 2 weeks. Partner needs treatment. [facility] notified.

## 2017-06-15 ENCOUNTER — Ambulatory Visit (INDEPENDENT_AMBULATORY_CARE_PROVIDER_SITE_OTHER): Payer: 59 | Admitting: Obstetrics and Gynecology

## 2017-06-15 ENCOUNTER — Encounter: Payer: Self-pay | Admitting: Obstetrics and Gynecology

## 2017-06-15 VITALS — BP 122/74 | HR 101 | Ht 65.0 in | Wt 190.1 lb

## 2017-06-15 DIAGNOSIS — N809 Endometriosis, unspecified: Secondary | ICD-10-CM | POA: Diagnosis not present

## 2017-06-15 DIAGNOSIS — A749 Chlamydial infection, unspecified: Secondary | ICD-10-CM | POA: Diagnosis not present

## 2017-06-15 DIAGNOSIS — R1010 Upper abdominal pain, unspecified: Secondary | ICD-10-CM | POA: Diagnosis not present

## 2017-06-15 DIAGNOSIS — R110 Nausea: Secondary | ICD-10-CM

## 2017-06-15 NOTE — Patient Instructions (Signed)
1. Test of cure for Chlamydia is obtained today 2. Continue with Seasonale oral contraceptives for adjuvant of endometriosis 3. GI referral for chronic nausea is placed 4. Return in 6 months for follow-up on endometriosis

## 2017-06-15 NOTE — Progress Notes (Signed)
Chief complaint: 1. History of chlamydia, treated, needing test of cure 2. Chronic nausea 3. Upper abdominal pain 4. Endometriosis  21 year old white female, on Seasonale oral contraceptive for management of abnormal uterine bleeding and endometriosis, presents for test of cure for positive chlamydia testing at last visit. She did take Zithromax 1 g by mouth. She denies any vaginal discharge or pelvic pain at this time. Currently she is on her menses.  Other concerns today include chronic nausea with upper abdominal pain having difficulty with oral intake of solids. She is not having any emesis. She does not report any significant change in bowel function. She is on omeprazole prescribed by primary care and has been inconsistently taking the medication. She has not had a GI evaluation. She has missed work recently because of her abdominal pain and nausea.  Past Medical History:  Diagnosis Date  . Anxiety   . Chronic kidney disease   . Depression   . GERD (gastroesophageal reflux disease)    Past Surgical History:  Procedure Laterality Date  . LAPAROSCOPY N/A 05/06/2015   Procedure: LAPAROSCOPY DIAGNOSTIC AND BIOPSIES;  Surgeon: Herold Harms, MD;  Location: [facility] ORS;  Service: Gynecology;  Laterality: N/A;  . WISDOM TOOTH EXTRACTION  2012    OBJECTIVE: BP 122/74   Pulse (!) 101   Ht  (1.651 m)   Wt 190 lb 1.6 oz (86.2 kg)   LMP 06/13/2017   BMI 31.63 kg/m  Physical exam-deferred  ASSESSMENT: 1. History of chlamydia, status post treatment, needing test of cure 2. Chronic nausea with upper abdominal pain affecting day-to-day activities; no recent GI evaluation  PLAN: 1. Chlamydia test of cure today 2. Continue omeprazole 3. Referral to GI medicine for further evaluation and management 4. Continue Seasonale contraceptives for abnormal uterine bleeding management and control of endometriosis 5. Return in 6 months for follow-up on endometriosis  A total of 15  minutes were spent face-to-face with the patient during this encounter and over half of that time dealt with counseling and coordination of care.  Herold Harms, MD  Note: This dictation was prepared with Dragon dictation along with smaller phrase technology. Any transcriptional errors that result from this process are unintentional.

## 2017-06-16 LAB — CHLAMYDIA/GONOCOCCUS/TRICHOMONAS, NAA
CHLAMYDIA BY NAA: NEGATIVE
Gonococcus by NAA: NEGATIVE
TRICH VAG BY NAA: NEGATIVE

## 2017-07-22 ENCOUNTER — Encounter: Payer: Self-pay | Admitting: Gastroenterology

## 2017-07-22 ENCOUNTER — Ambulatory Visit (INDEPENDENT_AMBULATORY_CARE_PROVIDER_SITE_OTHER): Payer: 59 | Admitting: Gastroenterology

## 2017-07-22 VITALS — BP 119/70 | HR 95 | Ht 65.0 in | Wt 187.0 lb

## 2017-07-22 DIAGNOSIS — R112 Nausea with vomiting, unspecified: Secondary | ICD-10-CM

## 2017-07-22 NOTE — Progress Notes (Signed)
Gastroenterology Consultation  Referring Provider:     Defrancesco, Prentice Docker, * Primary Care Physician:  Patient, No Pcp Per Primary Gastroenterologist:  Dr. Servando Snare     Reason for Consultation:     Nausea        HPI:   Heather Duncan is a 21 y.o. y/o female referred for consultation & management of nausea by Dr. Patient, No Pcp Per.  This patient comes in today with a history of nausea and vomiting. The patient reports that is intermittent and has had it off and on for some time. The patient states that the only thing that has helped her in the past was Prilosec 20 mg that she takes in the evening. She states that it is since come back. 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 The patient has been under more stress recently. The patient states that sometimes her nausea is in the morning and sometimes it is in the evening. She does not report any change with greasy foods or fatty foods. There is also no report of any unexplained weight loss  Past Medical History:  Diagnosis Date  . Anxiety   . Chronic kidney disease   . Depression   . GERD (gastroesophageal reflux disease)     Past Surgical History:  Procedure Laterality Date  . LAPAROSCOPY N/A 05/06/2015   Procedure: LAPAROSCOPY DIAGNOSTIC AND BIOPSIES;  Surgeon: Herold Harms, MD;  Location: [facility] ORS;  Service: Gynecology;  Laterality: N/A;  . WISDOM TOOTH EXTRACTION  2012    Prior to Admission medications   Medication Sig Start Date End Date Taking? Authorizing Provider  DULoxetine (CYMBALTA) 20 MG capsule Take 20 mg by mouth daily.   Yes [provider]  levonorgestrel-ethinyl estradiol (SEASONALE,INTROVALE,JOLESSA) 0.15-0.03 MG tablet Take 1 tablet by mouth daily. Pt to take 84/7 regimen. 11/11/16  Yes Defrancesco, Prentice Docker, MD  LORazepam (ATIVAN) 0.5 MG tablet Take 1 tablet (0.5 mg total) by mouth daily. 06/16/15  Yes Jene Every, MD   omeprazole (PRILOSEC) 20 MG capsule Take 20 mg by mouth daily.   Yes [provider]    Family History  Problem Relation Age of Onset  . Hypertension Father   . Breast cancer Neg Hx   . Ovarian cancer Neg Hx   . Diabetes Neg Hx   . Heart disease Neg Hx      Social History  Substance Use Topics  . Smoking status: Current Some Day Smoker    Packs/day: 0.50  . Smokeless tobacco: Never Used  . Alcohol use Yes     Comment: occasionally    Allergies as of 07/22/2017  . (No Known Allergies)    Review of Systems:    All systems reviewed and negative except where noted in HPI.   Physical Exam:  BP 119/70   Pulse 95   Ht 5\' 5"  (1.651 m)   Wt 187 lb (84.8 kg)   BMI 31.12 kg/m  No LMP recorded. Patient is not currently having periods (Reason: Oral contraceptives). Psych:  Alert and cooperative. Normal mood and affect. General:   Alert,  Well-developed, well-nourished, pleasant and cooperative in NAD Head:  Normocephalic and atraumatic. Eyes:  Sclera clear, no icterus.   Conjunctiva pink. Ears:  Normal auditory acuity. Nose:  No deformity, discharge, or lesions. Mouth:  No deformity or lesions,oropharynx pink & moist. Neck:  Supple; no masses or thyromegaly. Lungs:  Respirations  even and unlabored.  Clear throughout to auscultation.   No wheezes, crackles, or rhonchi. No acute distress. Heart:  Regular rate and rhythm; no murmurs, clicks, rubs, or gallops. Abdomen:  Normal bowel sounds.  No bruits.  Soft, non-tender and non-distended without masses, hepatosplenomegaly or hernias noted.  No guarding or rebound tenderness.  Negative Carnett sign.   Rectal:  Deferred.  Msk:  Symmetrical without gross deformities.  Good, equal movement & strength bilaterally. Pulses:  Normal pulses noted. Extremities:  No clubbing or edema.  No cyanosis. Neurologic:  Alert and oriented x3;  grossly normal neurologically. Skin:  Intact without significant lesions or rashes.  No  jaundice. Lymph Nodes:  No significant cervical adenopathy. Psych:  Alert and cooperative. Normal mood and affect.  Imaging Studies: No results found.  Assessment and Plan:   Heather Duncan is a 21 y.o. y/o female who reports nausea that is intermittent but worse with alcohol and smoking. She reports that it is not associated with any certain food or time of day although she does notice it to be more in the mornings. The patient has been told to stop taking her omeprazole and she has been given samples of the excellent to see if that helps her symptoms better. The patient will contact me if the symptoms are improved and will be given a prescription for Dexilant. The patient and her mother have been explained the plan and agree with it.  Midge Miniumarren Willadean Guyton, MD. Clementeen GrahamFACG   Note: This dictation was prepared with Dragon dictation along with smaller phrase technology. Any transcriptional errors that result from this process are unintentional.

## 2017-08-02 ENCOUNTER — Telehealth: Payer: Self-pay | Admitting: Gastroenterology

## 2017-08-02 NOTE — Telephone Encounter (Signed)
Attempted to return patients phone call regarding an appt with Dr. Servando SnareWohl. No voice mail.

## 2017-08-06 ENCOUNTER — Telehealth: Payer: Self-pay | Admitting: Gastroenterology

## 2017-08-06 NOTE — Telephone Encounter (Signed)
Patient stated that she finished the Dexilant and feels it wasn't working. Please advise her what to do. I was going to schedule her but Dr. Servando SnareWohl is booked out.

## 2017-08-23 ENCOUNTER — Telehealth: Payer: Self-pay | Admitting: Gastroenterology

## 2017-08-23 NOTE — Telephone Encounter (Signed)
Tried contacting pt but vm box was full. Not able to leave a message.

## 2017-08-23 NOTE — Telephone Encounter (Signed)
Attempted to call patient to schedule appt or see if she needed a rx called in. No voice mail.

## 2017-08-23 NOTE — Telephone Encounter (Signed)
Patient called and stated she is still waiting on you to call her regarding the last message. I tried to call her but she doesn't have voice mail. Please try to call her back today.

## 2017-08-24 ENCOUNTER — Telehealth: Payer: Self-pay | Admitting: Gastroenterology

## 2017-08-24 NOTE — Telephone Encounter (Signed)
*  STAT* If patient is at the pharmacy, call can be transferred to refill team.   1. Which medications need to be refilled? (please list name of each medication and dose if known) Dexilant   2. Which pharmacy/location (including street and city if local pharmacy) is medication to be sent to? [facility] [address], [place]   3. Do they need a 30 day or 90 day supply? 30 day

## 2017-08-24 NOTE — Telephone Encounter (Signed)
LVM for pt to return my call.

## 2017-08-24 NOTE — Telephone Encounter (Signed)
Patients mother called and Heather Duncan's reflux is not better. What should the patient do? Please call Heather Duncan.

## 2017-08-26 NOTE — Telephone Encounter (Signed)
Spoke with pt's mother, Misty StanleyLisa and she advised me Ivor MessierCora had already spoken with someone at our office.

## 2017-08-27 ENCOUNTER — Other Ambulatory Visit: Payer: Self-pay

## 2017-08-27 MED ORDER — PANTOPRAZOLE SODIUM 40 MG PO TBEC: 40.0000 mg | DELAYED_RELEASE_TABLET | Freq: Every day | ORAL | 0 refills | Status: DC

## 2017-09-27 ENCOUNTER — Other Ambulatory Visit: Payer: Self-pay

## 2017-09-27 MED ORDER — LEVONORGEST-ETH ESTRAD 91-DAY 0.15-0.03 MG PO TABS: 1.0000 | ORAL_TABLET | Freq: Every day | ORAL | 0 refills | Status: DC

## 2017-10-05 ENCOUNTER — Other Ambulatory Visit: Payer: Self-pay | Admitting: Gastroenterology

## 2017-10-05 NOTE — Telephone Encounter (Signed)
*  STAT* If patient is at the pharmacy, call can be transferred to refill team.   1. Which medications need to be refilled? (please list name of each medication and dose if known) Pantoprazole 40 mg  2. Which pharmacy/location (including street and city if local pharmacy) is medication to be sent to? [facility] [place]   3. Do they need a 30 day or 90 day supply? 90 day

## 2017-10-12 ENCOUNTER — Other Ambulatory Visit: Payer: Self-pay

## 2017-10-12 ENCOUNTER — Encounter: Payer: 59 | Admitting: Obstetrics and Gynecology

## 2017-10-12 MED ORDER — PANTOPRAZOLE SODIUM 40 MG PO TBEC: 40.0000 mg | DELAYED_RELEASE_TABLET | Freq: Every day | ORAL | 6 refills | Status: DC

## 2017-10-12 NOTE — Telephone Encounter (Signed)
Rx for Panoprazole 40mg  sent to pt's pharmacy per request.

## 2017-10-25 ENCOUNTER — Encounter: Payer: Self-pay | Admitting: Obstetrics and Gynecology

## 2017-10-25 ENCOUNTER — Ambulatory Visit: Payer: 59 | Admitting: Obstetrics and Gynecology

## 2017-10-25 VITALS — BP 125/88 | HR 101 | Ht 65.0 in | Wt 169.2 lb

## 2017-10-25 DIAGNOSIS — B373 Candidiasis of vulva and vagina: Secondary | ICD-10-CM

## 2017-10-25 DIAGNOSIS — R350 Frequency of micturition: Secondary | ICD-10-CM | POA: Diagnosis not present

## 2017-10-25 DIAGNOSIS — B3731 Acute candidiasis of vulva and vagina: Secondary | ICD-10-CM

## 2017-10-25 LAB — POCT URINALYSIS DIPSTICK
Bilirubin, UA: NEGATIVE
Blood, UA: NEGATIVE
GLUCOSE UA: NEGATIVE
Ketones, UA: NEGATIVE
LEUKOCYTES UA: NEGATIVE
NITRITE UA: NEGATIVE
Odor: NEGATIVE
PROTEIN UA: NEGATIVE
Spec Grav, UA: 1.015 (ref 1.010–1.025)
Urobilinogen, UA: 0.2 E.U./dL
pH, UA: 7 (ref 5.0–8.0)

## 2017-10-25 MED ORDER — FLUCONAZOLE 150 MG PO TABS: 150.0000 mg | ORAL_TABLET | ORAL | 0 refills | Status: DC | PRN

## 2017-10-25 NOTE — Progress Notes (Signed)
Chief complaint: 1.  Vaginal itching  22 year old female, para 0, using OCPs and occasionally condoms for contraception, with history of endometriosis, presents for evaluation of vaginal and vulvar itching.  No recent antibiotic treatment.  Recent new partner.  Patient denies UTI symptoms of new onset other than chronic frequency.  No fever chills or sweats.  No flank pain.  Past Medical History:  Diagnosis Date  . Anxiety   . Chronic kidney disease   . Depression   . GERD (gastroesophageal reflux disease)    Past Surgical History:  Procedure Laterality Date  . LAPAROSCOPY N/A 05/06/2015   Procedure: LAPAROSCOPY DIAGNOSTIC AND BIOPSIES;  Surgeon: Herold HarmsMartin A Defrancesco, MD;  Location: [facility] ORS;  Service: Gynecology;  Laterality: N/A;  . WISDOM TOOTH EXTRACTION  2012   OBJECTIVE: BP 125/88   Pulse (!) 101   Ht 5\' 5"  (1.651 m)   Wt 169 lb 3.2 oz (76.7 kg)   LMP 09/19/2017 (Approximate)   BMI 28.16 kg/m  Pleasant female no acute distress.  Alert and oriented. Back: No CVA tenderness Abdomen: Soft, nontender without organomegaly Pelvic: External genitalia-normal BUS-normal Vagina-minimal white discharge Cervix-no lesions; no discharge; no cervical motion tenderness Uterus-midplane, normal size and shape, mobile, nontender Adnexa-nonpalpable nontender Rectovaginal-normal external exam  PROCEDURE: Wet prep Normal saline-moderate white blood cells; no clue cells; no trichomonas KOH-branching hyphae seen  ASSESSMENT: 1.  Monilia vaginitis 2.  Possible STD exposure  PLAN: 1.  Wet prep is noted 2.  Nuswab plus 3.  Diflucan 150 mg orally x1 dose; may repeat it in 3 days if still symptomatic 4.  Follow-up as needed  A total of 15 minutes were spent face-to-face with the patient during this encounter and over half of that time dealt with counseling and coordination of care.  Herold HarmsMartin A Defrancesco, MD  Note: This dictation was prepared with Dragon dictation along with smaller  phrase technology. Any transcriptional errors that result from this process are unintentional.

## 2017-10-25 NOTE — Patient Instructions (Signed)
1.  STD testing is performed today 2.  Wet prep shows monilia vaginitis (yeast) 3.  Take Diflucan 150 mg orally x1 dose;May repeat the dose in 3 days if still with symptoms 4.  Results from testing will be made available through my chart

## 2017-10-27 LAB — NUSWAB VAGINITIS PLUS (VG+)
CANDIDA ALBICANS, NAA: POSITIVE — AB
Candida glabrata, NAA: NEGATIVE
Chlamydia trachomatis, NAA: NEGATIVE
NEISSERIA GONORRHOEAE, NAA: NEGATIVE
Trich vag by NAA: NEGATIVE

## 2017-10-27 LAB — URINE CULTURE

## 2017-12-15 ENCOUNTER — Encounter: Payer: 59 | Admitting: Obstetrics and Gynecology

## 2017-12-29 ENCOUNTER — Encounter: Payer: Self-pay | Admitting: Obstetrics and Gynecology

## 2017-12-29 ENCOUNTER — Ambulatory Visit (INDEPENDENT_AMBULATORY_CARE_PROVIDER_SITE_OTHER): Payer: 59 | Admitting: Obstetrics and Gynecology

## 2017-12-29 VITALS — BP 111/71 | HR 92 | Ht 65.0 in | Wt 170.1 lb

## 2017-12-29 DIAGNOSIS — N809 Endometriosis, unspecified: Secondary | ICD-10-CM

## 2017-12-29 DIAGNOSIS — Z79899 Other long term (current) drug therapy: Secondary | ICD-10-CM | POA: Diagnosis not present

## 2017-12-29 DIAGNOSIS — R102 Pelvic and perineal pain: Secondary | ICD-10-CM

## 2017-12-29 NOTE — Progress Notes (Signed)
Chief complaint: 1.  Endometriosis 2.  Medication management  Patient presents for interval follow-up on endometriosis management.  She is currently on Seasonale oral contraceptives with having cycles every 3 months.  Bleeding is reasonably controlled.  Dysmenorrhea is minimal.  She is not having any intermenstrual bleeding.  Overall pelvic pain is under control and she is not having to take analgesics for management. Patient reports normal bowel function.  She does report normal bladder function except when she is having sex.  At that time she notices urinary urgency during and post Coitus.  Past medical history: Past surgical history, problem list, medications, and allergies are reviewed  OBJECTIVE: BP 111/71   Pulse 92   Ht 5\' 5"  (1.651 m)   Wt 170 lb 1.6 oz (77.2 kg)   LMP 12/23/2017 (Exact Date)   BMI 28.31 kg/m  Pleasant female in no acute distress.  Alert and oriented.  Affect is appropriate. Physical exam-deferred  ASSESSMENT: 1.  History of symptomatic endometriosis, controlled on medication 2.  No medication side effects 3.  No current need for analgesic therapy  PLAN: 1.  Continue with Seasonale oral contraceptives 2.  Return in 3 months for follow-up and annual exam 3.  Patient may use Tylenol and nonsteroidal anti-inflammatory medications as needed for any symptomatic pelvic pain  A total of 15 minutes were spent face-to-face with the patient during this encounter and over half of that time dealt with counseling and coordination of care.  Herold HarmsMartin A Delanee Xin, MD  Note: This dictation was prepared with Dragon dictation along with smaller phrase technology. Any transcriptional errors that result from this process are unintentional.

## 2017-12-29 NOTE — Patient Instructions (Signed)
1.  Continue using Seasonale oral contraceptives as written 2.  Return in mid June for annual exam

## 2018-01-05 ENCOUNTER — Other Ambulatory Visit: Payer: Self-pay | Admitting: Obstetrics and Gynecology

## 2018-02-28 NOTE — Progress Notes (Deleted)
ANNUAL PREVENTATIVE CARE GYN  ENCOUNTER NOTE  Subjective:       Heather Duncan is a 22 y.o. G0P0000 female here for a routine annual gynecologic exam.  Current complaints: 1.  NONE    Gynecologic History No LMP recorded. (Menstrual status: Oral contraceptives). Contraception: OCP (estrogen/progesterone) Last Pap: 11/11/2016 neg/neg/neg  Last mammogram:na  Obstetric History OB History  Gravida Para Term Preterm AB Living  0 0 0 0 0 0  SAB TAB Ectopic Multiple Live Births  0 0 0 0      Past Medical History:  Diagnosis Date  . Anxiety   . Chronic kidney disease   . Depression   . GERD (gastroesophageal reflux disease)     Past Surgical History:  Procedure Laterality Date  . LAPAROSCOPY N/A 05/06/2015   Procedure: LAPAROSCOPY DIAGNOSTIC AND BIOPSIES;  Surgeon: Herold HarmsMartin A Defrancesco, MD;  Location: [facility];  Service: Gynecology;  Laterality: N/A;  . WISDOM TOOTH EXTRACTION  2012    Current Outpatient Medications on File Prior to Visit  Medication Sig Dispense Refill  . INTROVALE 0.15-0.03 MG tablet TAKE 1 TABLET BY MOUTH DAILY 91 tablet 0  . LORazepam (ATIVAN) 0.5 MG tablet Take 1 tablet (0.5 mg total) by mouth daily. 12 tablet 0  . pantoprazole (PROTONIX) 40 MG tablet Take 1 tablet (40 mg total) by mouth daily. 30 tablet 6   No current facility-administered medications on file prior to visit.     No Known Allergies  Social History   Socioeconomic History  . Marital status: Single    Spouse name: Not on file  . Number of children: Not on file  . Years of education: Not on file  . Highest education level: Not on file  Occupational History  . Not on file  Social Needs  . Financial resource strain: Not on file  . Food insecurity:    Worry: Not on file    Inability: Not on file  . Transportation needs:    Medical: Not on file    Non-medical: Not on file  Tobacco Use  . Smoking status: Current Some Day Smoker    Packs/day: 0.50  . Smokeless tobacco: Never Used   Substance and Sexual Activity  . Alcohol use: Yes    Comment: occasionally  . Drug use: Yes    Frequency: 7.0 times per week    Types: Marijuana    Comment: OCCAS  . Sexual activity: Yes    Birth control/protection: Pill  Lifestyle  . Physical activity:    Days per week: Not on file    Minutes per session: Not on file  . Stress: Not on file  Relationships  . Social connections:    Talks on phone: Not on file    Gets together: Not on file    Attends religious service: Not on file    Active member of club or organization: Not on file    Attends meetings of clubs or organizations: Not on file    Relationship status: Not on file  . Intimate partner violence:    Fear of current or ex partner: Not on file    Emotionally abused: Not on file    Physically abused: Not on file    Forced sexual activity: Not on file  Other Topics Concern  . Not on file  Social History Narrative  . Not on file    Family History  Problem Relation Age of Onset  . Hypertension Father   . Breast cancer Neg  Hx   . Ovarian cancer Neg Hx   . Diabetes Neg Hx   . Heart disease Neg Hx     The following portions of the patient's history were reviewed and updated as appropriate: allergies, current medications, past family history, past medical history, past social history, past surgical history and problem list.  Review of Systems   Objective:   There were no vitals taken for this visit. CONSTITUTIONAL: Well-developed, well-nourished female in no acute distress.  PSYCHIATRIC: Normal mood and affect. Normal behavior. Normal judgment and thought content. NEUROLGIC: Alert and oriented to person, place, and time. Normal muscle tone coordination. No cranial nerve deficit noted. HENT:  Normocephalic, atraumatic, External right and left ear normal. EYES: Conjunctivae and EOM are normal.  No scleral icterus.  NECK: Normal range of motion, supple, no masses.  Normal thyroid.  SKIN: Skin is warm and dry. No  rash noted. Not diaphoretic. No erythema. No pallor. CARDIOVASCULAR: Normal heart rate noted, regular rhythm, no murmur. RESPIRATORY: Clear to auscultation bilaterally. Effort and breath sounds normal, no problems with respiration noted. BREASTS: Symmetric in size. No masses, skin changes, nipple drainage, or lymphadenopathy. ABDOMEN: Soft, normal bowel sounds, no distention noted.  No tenderness, rebound or guarding.  BLADDER: Normal PELVIC:  External Genitalia: Normal  BUS: Normal  Vagina: Normal  Cervix: Normal; minimal bloody mucus at os cleaned with [place] Q-tip; no cervical motion tenderness  Uterus: Normal; midplane, mobile, nontender, normal size and shape  Adnexa: Normal; nonpalpable nontender  RV: External Exam NormaI  MUSCULOSKELETAL: Normal range of motion. No tenderness.  No cyanosis, clubbing, or edema.  2+ distal pulses. LYMPHATIC: No Axillary, Supraclavicular, or Inguinal Adenopathy.    Assessment:   Annual gynecologic examination 22 y.o. Contraception: OCP (estrogen/progesterone) BMI-31  History of alcohol abuse, substance abuse History of dysmenorrhea and menorrhagia, controlled with birth control pills   Plan:  Pap: Pap, Reflex if ASCUS and GC/CT NAAT Mammogram: Not Indicated Stool Guaiac Test: not indicated LABS: THRU PCP Routine preventative health maintenance measures emphasized: Exercise/Diet/Weight control, Tobacco Warnings, Alcohol/Substance use risks and Safe Sex Birth control pills refilled. Encouraged 10- 12 pound weight loss in 1 year Return to Clinic - 1 [address] [place], [place]

## 2018-03-10 ENCOUNTER — Encounter: Payer: 59 | Admitting: Obstetrics and Gynecology

## 2018-03-28 NOTE — Progress Notes (Signed)
ANNUAL PREVENTATIVE CARE GYN  ENCOUNTER NOTE  Subjective:       Heather Duncan is a 22 y.o. G0P0000 female here for a routine annual gynecologic exam.  Current complaints:  1.  Abdominal pain-after IC; crampiness and aching; no typical deep thrusting dyspareunia; 3 out of 10 in intensity  2. Urinary urgency- all the time, with exacerbations during and after intercourse; no fever/chills/flank pain/dysuria 3.  Irregular spotting x1 month; patient reports taking her pills daily within 2 hours (timely manner).  No missed pills.  Spotting associated with black tarry sludge characteristics 4.  History of endometriosis; status post 6 months of Depo-Lupron therapy   Gynecologic History No LMP recorded. (Menstrual status: Oral contraceptives). Contraception: OCP (estrogen/progesterone) Last Pap: 11/11/2016 neg/neg/neg  Last mammogram:na  Obstetric History OB History  Gravida Para Term Preterm AB Living  0 0 0 0 0 0  SAB TAB Ectopic Multiple Live Births  0 0 0 0      Past Medical History:  Diagnosis Date  . Anxiety   . Chronic kidney disease   . Depression   . GERD (gastroesophageal reflux disease)     Past Surgical History:  Procedure Laterality Date  . LAPAROSCOPY N/A 05/06/2015   Procedure: LAPAROSCOPY DIAGNOSTIC AND BIOPSIES;  Surgeon: Herold Harms, MD;  Location: [facility]S;  Service: Gynecology;  Laterality: N/A;  . WISDOM TOOTH EXTRACTION  2012    Current Outpatient Medications on File Prior to Visit  Medication Sig Dispense Refill  . INTROVALE 0.15-0.03 MG tablet TAKE 1 TABLET BY MOUTH DAILY 91 tablet 0  . LORazepam (ATIVAN) 0.5 MG tablet Take 1 tablet (0.5 mg total) by mouth daily. 12 tablet 0  . pantoprazole (PROTONIX) 40 MG tablet Take 1 tablet (40 mg total) by mouth daily. 30 tablet 6   No current facility-administered medications on file prior to visit.     No Known Allergies  Social History   Socioeconomic History  . Marital status: Single    Spouse name:  Not on file  . Number of children: Not on file  . Years of education: Not on file  . Highest education level: Not on file  Occupational History  . Not on file  Social Needs  . Financial resource strain: Not on file  . Food insecurity:    Worry: Not on file    Inability: Not on file  . Transportation needs:    Medical: Not on file    Non-medical: Not on file  Tobacco Use  . Smoking status: Current Some Day Smoker    Packs/day: 0.50  . Smokeless tobacco: Never Used  Substance and Sexual Activity  . Alcohol use: Yes    Comment: occasionally  . Drug use: Yes    Frequency: 7.0 times per week    Types: Marijuana    Comment: OCCAS  . Sexual activity: Yes    Birth control/protection: Pill  Lifestyle  . Physical activity:    Days per week: Not on file    Minutes per session: Not on file  . Stress: Not on file  Relationships  . Social connections:    Talks on phone: Not on file    Gets together: Not on file    Attends religious service: Not on file    Active member of club or organization: Not on file    Attends meetings of clubs or organizations: Not on file    Relationship status: Not on file  . Intimate partner violence:  Fear of current or ex partner: Not on file    Emotionally abused: Not on file    Physically abused: Not on file    Forced sexual activity: Not on file  Other Topics Concern  . Not on file  Social History Narrative  . Not on file    Family History  Problem Relation Age of Onset  . Hypertension Father   . Breast cancer Neg Hx   . Ovarian cancer Neg Hx   . Diabetes Neg Hx   . Heart disease Neg Hx     The following portions of the patient's history were reviewed and updated as appropriate: allergies, current medications, past family history, past medical history, past social history, past surgical history and problem list.  Review of Systems Review of Systems  Constitutional: Negative for chills, diaphoresis and fever.  HENT: Negative.    Eyes: Negative.   Respiratory: Negative.        Continues to smoke primarily tobacco  Cardiovascular: Negative.   Gastrointestinal: Negative.   Genitourinary: Positive for frequency and urgency. Negative for dysuria, flank pain and hematuria.       Atypical vaginal bleeding over the past month  Musculoskeletal: Negative.   Skin: Negative.   Neurological: Negative.   Endo/Heme/Allergies: Negative.   Psychiatric/Behavioral: Negative.      Objective:   BP 125/83   Pulse 90   Ht 5\' 5"  (1.651 m)   Wt 173 lb 4.8 oz (78.6 kg)   LMP 01/05/2018   BMI 28.84 kg/m  CONSTITUTIONAL: Well-developed, well-nourished female in no acute distress.  PSYCHIATRIC: Normal mood and affect. Normal behavior. Normal judgment and thought content. NEUROLGIC: Alert and oriented to person, place, and time. Normal muscle tone coordination. No cranial nerve deficit noted. HENT:  Normocephalic, atraumatic, External right and left ear normal. EYES: Conjunctivae and EOM are normal.  No scleral icterus.  NECK: Normal range of motion, supple, no masses.  Normal thyroid.  SKIN: Skin is warm and dry. No rash noted. Not diaphoretic. No erythema. No pallor. CARDIOVASCULAR: Normal heart rate noted, regular rhythm, no murmur. RESPIRATORY: Clear to auscultation bilaterally. Effort and breath sounds normal, no problems with respiration noted. BREASTS: Symmetric in size. No masses, skin changes, nipple drainage, or lymphadenopathy. ABDOMEN: Soft, normal bowel sounds, no distention noted.  No tenderness, rebound or guarding.  BLADDER: Normal; nontender PELVIC:  External Genitalia: Normal  BUS: Normal  Vagina: Anterior vaginal wall tenderness  Cervix: Normal; minimal brown/black blood loss; mild cervical motion tenderness 3/10  Uterus: Normal; midplane, mobile, 3/4 tender, normal size and shape  Adnexa: Normal; nonpalpable nontender  RV: External Exam NormaI  MUSCULOSKELETAL: Normal range of motion. No tenderness.  No  cyanosis, clubbing, or edema.  2+ distal pulses. LYMPHATIC: No Axillary, Supraclavicular, or Inguinal Adenopathy.    Assessment:   Annual gynecologic examination 22 y.o. Contraception: OCP (estrogen/progesterone) bmi-28 History of alcohol abuse, substance abuse, tobacco abuse; currently reports limited alcohol intake and continued tobacco use without drug use History of dysmenorrhea and menorrhagia, controlled with birth control pills Recent history of abnormal uterine bleeding her over the past month   Plan:  Pap: Pap, Reflex if ASCUS and GC/CT NAAT Mammogram: Not Indicated Stool Guaiac Test: not indicated LABS:lipid tsh fbs a1c, STD testing (blood) Routine preventative health maintenance measures emphasized: Exercise/Diet/Weight control, Tobacco Warnings, Alcohol/Substance use risks and Safe Sex Birth control pills refilled. Encouraged healthy eating, exercise, with controlled weight loss Strongly encouraged tobacco use cessation Return to Clinic - 1 Year Return in  3 months if abnormal uterine bleeding persists despite continued consistent OCP use  Darol Destinerystal Miller, CMA  Herold HarmsMartin A Olyvia Gopal, MD  Note: This dictation was prepared with Dragon dictation along with smaller phrase technology. Any transcriptional errors that result from this process are unintentional.

## 2018-03-30 ENCOUNTER — Other Ambulatory Visit (HOSPITAL_COMMUNITY)
Admission: RE | Admit: 2018-03-30 | Discharge: 2018-03-30 | Disposition: A | Discharge status: Home / Self Care | Payer: 59 | Source: Ambulatory Visit | Attending: Obstetrics and Gynecology | Admitting: Obstetrics and Gynecology

## 2018-03-30 ENCOUNTER — Encounter: Payer: Self-pay | Admitting: Obstetrics and Gynecology

## 2018-03-30 ENCOUNTER — Ambulatory Visit (INDEPENDENT_AMBULATORY_CARE_PROVIDER_SITE_OTHER): Payer: 59 | Admitting: Obstetrics and Gynecology

## 2018-03-30 VITALS — BP 125/83 | HR 90 | Ht 65.0 in | Wt 173.3 lb

## 2018-03-30 DIAGNOSIS — Z720 Tobacco use: Secondary | ICD-10-CM

## 2018-03-30 DIAGNOSIS — N939 Abnormal uterine and vaginal bleeding, unspecified: Secondary | ICD-10-CM | POA: Insufficient documentation

## 2018-03-30 DIAGNOSIS — Z01419 Encounter for gynecological examination (general) (routine) without abnormal findings: Secondary | ICD-10-CM | POA: Diagnosis present

## 2018-03-30 DIAGNOSIS — Z202 Contact with and (suspected) exposure to infections with a predominantly sexual mode of transmission: Secondary | ICD-10-CM | POA: Diagnosis not present

## 2018-03-30 DIAGNOSIS — R3915 Urgency of urination: Secondary | ICD-10-CM | POA: Diagnosis not present

## 2018-03-30 DIAGNOSIS — N809 Endometriosis, unspecified: Secondary | ICD-10-CM

## 2018-03-30 DIAGNOSIS — Z3041 Encounter for surveillance of contraceptive pills: Secondary | ICD-10-CM | POA: Diagnosis not present

## 2018-03-30 LAB — POCT URINALYSIS DIPSTICK
BILIRUBIN UA: NEGATIVE
GLUCOSE UA: NEGATIVE
KETONES UA: NEGATIVE
Leukocytes, UA: NEGATIVE
Nitrite, UA: NEGATIVE
Odor: NEGATIVE
PH UA: 5 (ref 5.0–8.0)
Protein, UA: NEGATIVE
RBC UA: NEGATIVE
UROBILINOGEN UA: 0.2 U/dL

## 2018-03-30 MED ORDER — LEVONORGEST-ETH ESTRAD 91-DAY 0.15-0.03 MG PO TABS: 1.0000 | ORAL_TABLET | Freq: Every day | ORAL | 3 refills | Status: DC

## 2018-03-30 NOTE — Addendum Note (Signed)
Addended by: Marchelle FolksMILLER, Ashlynn Gunnels G on: 03/30/2018 11:58 AM   Modules accepted: Orders

## 2018-03-30 NOTE — Patient Instructions (Signed)
1.  Pap smear is done. 2.  STD testing is completed 3.  Self breast awareness is encouraged 4.  Screening labs are obtained. 5.  Birth control pills are refilled for 1 year 6.  Safe sex practices encouraged 7.  Recommend healthy eating, exercise, and control weight loss 8.  Return in 1 year for annual exam  Health Maintenance, Female Adopting a healthy lifestyle and getting preventive care can go a long way to promote health and wellness. Talk with your health care provider about what schedule of regular examinations is right for you. This is a good chance for you to check in with your provider about disease prevention and staying healthy. In between checkups, there are plenty of things you can do on your own. Experts have done a lot of research about which lifestyle changes and preventive measures are most likely to keep you healthy. Ask your health care provider for more information. Weight and diet Eat a healthy diet  Be sure to include plenty of vegetables, fruits, low-fat dairy products, and lean protein.  Do not eat a lot of foods high in solid fats, added sugars, or salt.  Get regular exercise. This is one of the most important things you can do for your health. ? Most adults should exercise for at least 150 minutes each week. The exercise should increase your heart rate and make you sweat (moderate-intensity exercise). ? Most adults should also do strengthening exercises at least twice a week. This is in addition to the moderate-intensity exercise.  Maintain a healthy weight  Body mass index (BMI) is a measurement that can be used to identify possible weight problems. It estimates body fat based on height and weight. Your health care provider can help determine your BMI and help you achieve or maintain a healthy weight.  For females 79 years of age and older: ? A BMI below 18.5 is considered underweight. ? A BMI of 18.5 to 24.9 is normal. ? A BMI of 25 to 29.9 is considered  overweight. ? A BMI of 30 and above is considered obese.  Watch levels of cholesterol and blood lipids  You should start having your blood tested for lipids and cholesterol at 23 years of age, then have this test every 5 years.  You may need to have your cholesterol levels checked more often if: ? Your lipid or cholesterol levels are high. ? You are older than 23 years of age. ? You are at high risk for heart disease.  Cancer screening Lung Cancer  Lung cancer screening is recommended for adults 75-61 years old who are at high risk for lung cancer because of a history of smoking.  A yearly low-dose CT scan of the lungs is recommended for people who: ? Currently smoke. ? Have quit within the past 15 years. ? Have at least a 30-pack-year history of smoking. A pack year is smoking an average of one pack of cigarettes a day for 1 year.  Yearly screening should continue until it has been 15 years since you quit.  Yearly screening should stop if you develop a health problem that would prevent you from having lung cancer treatment.  Breast Cancer  Practice breast self-awareness. This means understanding how your breasts normally appear and feel.  It also means doing regular breast self-exams. Let your health care provider know about any changes, no matter how small.  If you are in your 20s or 30s, you should have a clinical breast exam (CBE) by  a health care provider every 1-3 years as part of a regular health exam.  If you are 53 or older, have a CBE every year. Also consider having a breast X-ray (mammogram) every year.  If you have a family history of breast cancer, talk to your health care provider about genetic screening.  If you are at high risk for breast cancer, talk to your health care provider about having an MRI and a mammogram every year.  Breast cancer gene (BRCA) assessment is recommended for women who have family members with BRCA-related cancers. BRCA-related cancers  include: ? Breast. ? Ovarian. ? Tubal. ? Peritoneal cancers.  Results of the assessment will determine the need for genetic counseling and BRCA1 and BRCA2 testing.  Cervical Cancer Your health care provider may recommend that you be screened regularly for cancer of the pelvic organs (ovaries, uterus, and vagina). This screening involves a pelvic examination, including checking for microscopic changes to the surface of your cervix (Pap test). You may be encouraged to have this screening done every 3 years, beginning at age 77.  For women ages 67-65, health care providers may recommend pelvic exams and Pap testing every 3 years, or they may recommend the Pap and pelvic exam, combined with testing for human papilloma virus (HPV), every 5 years. Some types of HPV increase your risk of cervical cancer. Testing for HPV may also be done on women of any age with unclear Pap test results.  Other health care providers may not recommend any screening for nonpregnant women who are considered low risk for pelvic cancer and who do not have symptoms. Ask your health care provider if a screening pelvic exam is right for you.  If you have had past treatment for cervical cancer or a condition that could lead to cancer, you need Pap tests and screening for cancer for at least 20 years after your treatment. If Pap tests have been discontinued, your risk factors (such as having a new sexual partner) need to be reassessed to determine if screening should resume. Some women have medical problems that increase the chance of getting cervical cancer. In these cases, your health care provider may recommend more frequent screening and Pap tests.  Colorectal Cancer  This type of cancer can be detected and often prevented.  Routine colorectal cancer screening usually begins at 23 years of age and continues through 23 years of age.  Your health care provider may recommend screening at an earlier age if you have risk factors  for colon cancer.  Your health care provider may also recommend using home test kits to check for hidden blood in the stool.  A small camera at the end of a tube can be used to examine your colon directly (sigmoidoscopy or colonoscopy). This is done to check for the earliest forms of colorectal cancer.  Routine screening usually begins at age 76.  Direct examination of the colon should be repeated every 5-10 years through 23 years of age. However, you may need to be screened more often if early forms of precancerous polyps or small growths are found.  Skin Cancer  Check your skin from head to toe regularly.  Tell your health care provider about any new moles or changes in moles, especially if there is a change in a mole's shape or color.  Also tell your health care provider if you have a mole that is larger than the size of a pencil eraser.  Always use sunscreen. Apply sunscreen liberally and repeatedly  throughout the day.  Protect yourself by wearing long sleeves, pants, a wide-brimmed hat, and sunglasses whenever you are outside.  Heart disease, diabetes, and high blood pressure  High blood pressure causes heart disease and increases the risk of stroke. High blood pressure is more likely to develop in: ? People who have blood pressure in the high end of the normal range (130-139/85-89 mm Hg). ? People who are overweight or obese. ? People who are African American.  If you are 82-37 years of age, have your blood pressure checked every 3-5 years. If you are 52 years of age or older, have your blood pressure checked every year. You should have your blood pressure measured twice-once when you are at a hospital or clinic, and once when you are not at a hospital or clinic. Record the average of the two measurements. To check your blood pressure when you are not at a hospital or clinic, you can use: ? An automated blood pressure machine at a pharmacy. ? A home blood pressure monitor.  If  you are between 25 years and 33 years old, ask your health care provider if you should take aspirin to prevent strokes.  Have regular diabetes screenings. This involves taking a blood sample to check your fasting blood sugar level. ? If you are at a normal weight and have a low risk for diabetes, have this test once every three years after 23 years of age. ? If you are overweight and have a high risk for diabetes, consider being tested at a younger age or more often. Preventing infection Hepatitis B  If you have a higher risk for hepatitis B, you should be screened for this virus. You are considered at high risk for hepatitis B if: ? You were born in a country where hepatitis B is common. Ask your health care provider which countries are considered high risk. ? Your parents were born in a high-risk country, and you have not been immunized against hepatitis B (hepatitis B vaccine). ? You have HIV or AIDS. ? You use needles to inject street drugs. ? You live with someone who has hepatitis B. ? You have had sex with someone who has hepatitis B. ? You get hemodialysis treatment. ? You take certain medicines for conditions, including cancer, organ transplantation, and autoimmune conditions.  Hepatitis C  Blood testing is recommended for: ? Everyone born from [birth year] through 1966. ? Anyone with known risk factors for hepatitis C.  Sexually transmitted infections (STIs)  You should be screened for sexually transmitted infections (STIs) including gonorrhea and chlamydia if: ? You are sexually active and are younger than 23 years of age. ? You are older than 23 years of age and your health care provider tells you that you are at risk for this type of infection. ? Your sexual activity has changed since you were last screened and you are at an increased risk for chlamydia or gonorrhea. Ask your health care provider if you are at risk.  If you do not have HIV, but are at risk, it may be recommended  that you take a prescription medicine daily to prevent HIV infection. This is called pre-exposure prophylaxis (PrEP). You are considered at risk if: ? You are sexually active and do not regularly use condoms or know the HIV status of your partner(s). ? You take drugs by injection. ? You are sexually active with a partner who has HIV.  Talk with your health care provider about whether you are  at high risk of being infected with HIV. If you choose to begin PrEP, you should first be tested for HIV. You should then be tested every 3 months for as long as you are taking PrEP. Pregnancy  If you are premenopausal and you may become pregnant, ask your health care provider about preconception counseling.  If you may become pregnant, take 400 to 800 micrograms (mcg) of folic acid every day.  If you want to prevent pregnancy, talk to your health care provider about birth control (contraception). Osteoporosis and menopause  Osteoporosis is a disease in which the bones lose minerals and strength with aging. This can result in serious bone fractures. Your risk for osteoporosis can be identified using a bone density scan.  If you are 30 years of age or older, or if you are at risk for osteoporosis and fractures, ask your health care provider if you should be screened.  Ask your health care provider whether you should take a calcium or vitamin D supplement to lower your risk for osteoporosis.  Menopause may have certain physical symptoms and risks.  Hormone replacement therapy may reduce some of these symptoms and risks. Talk to your health care provider about whether hormone replacement therapy is right for you. Follow these instructions at home:  Schedule regular health, dental, and eye exams.  Stay current with your immunizations.  Do not use any tobacco products including cigarettes, chewing tobacco, or electronic cigarettes.  If you are pregnant, do not drink alcohol.  If you are  breastfeeding, limit how much and how often you drink alcohol.  Limit alcohol intake to no more than 1 drink per day for nonpregnant women. One drink equals 12 ounces of beer, 5 ounces of wine, or 1 ounces of hard liquor.  Do not use street drugs.  Do not share needles.  Ask your health care provider for help if you need support or information about quitting drugs.  Tell your health care provider if you often feel depressed.  Tell your health care provider if you have ever been abused or do not feel safe at home. This information is not intended to replace advice given to you by your health care provider. Make sure you discuss any questions you have with your health care provider. Document Released: 03/23/2011 Document Revised: 02/13/2016 Document Reviewed: 06/11/2015 Elsevier Interactive Patient Education  [employer].

## 2018-03-31 ENCOUNTER — Encounter: Payer: Self-pay | Admitting: Obstetrics and Gynecology

## 2018-03-31 DIAGNOSIS — Z1159 Encounter for screening for other viral diseases: Secondary | ICD-10-CM

## 2018-03-31 DIAGNOSIS — B009 Herpesviral infection, unspecified: Secondary | ICD-10-CM | POA: Insufficient documentation

## 2018-03-31 LAB — HEPATITIS B SURFACE ANTIGEN: HEP B S AG: NEGATIVE

## 2018-03-31 LAB — CYTOLOGY - PAP
Chlamydia: NEGATIVE
NEISSERIA GONORRHEA: NEGATIVE

## 2018-03-31 LAB — HSV(HERPES SIMPLEX VRS) I + II AB-IGG: HSV 2 IGG, TYPE SPEC: 4.01 {index} — AB (ref 0.00–0.90)

## 2018-03-31 LAB — HIV ANTIBODY (ROUTINE TESTING W REFLEX): HIV Screen 4th Generation wRfx: NONREACTIVE

## 2018-03-31 LAB — HSV-2 IGG SUPPLEMENTAL TEST: HSV-2 IGG SUPPLEMENTAL TEST: POSITIVE — AB

## 2018-03-31 LAB — HEPATITIS C ANTIBODY: Hep C Virus Ab: <0.1 s/co ratio (ref 0.0–0.9)

## 2018-03-31 LAB — RPR: RPR Ser Ql: NONREACTIVE

## 2018-04-01 LAB — URINE CULTURE

## 2018-04-13 ENCOUNTER — Encounter: Payer: 59 | Admitting: Obstetrics and Gynecology

## 2018-04-14 ENCOUNTER — Ambulatory Visit (INDEPENDENT_AMBULATORY_CARE_PROVIDER_SITE_OTHER): Payer: 59 | Admitting: Obstetrics and Gynecology

## 2018-04-14 ENCOUNTER — Encounter: Payer: Self-pay | Admitting: Obstetrics and Gynecology

## 2018-04-14 ENCOUNTER — Other Ambulatory Visit (HOSPITAL_COMMUNITY)
Admission: RE | Admit: 2018-04-14 | Discharge: 2018-04-14 | Disposition: A | Discharge status: Home / Self Care | Payer: 59 | Source: Ambulatory Visit | Attending: Obstetrics and Gynecology | Admitting: Obstetrics and Gynecology

## 2018-04-14 VITALS — BP 130/85 | HR 105 | Ht 65.0 in | Wt 170.9 lb

## 2018-04-14 DIAGNOSIS — Z1159 Encounter for screening for other viral diseases: Secondary | ICD-10-CM | POA: Diagnosis not present

## 2018-04-14 DIAGNOSIS — R87612 Low grade squamous intraepithelial lesion on cytologic smear of cervix (LGSIL): Secondary | ICD-10-CM | POA: Insufficient documentation

## 2018-04-14 DIAGNOSIS — N870 Mild cervical dysplasia: Secondary | ICD-10-CM | POA: Diagnosis not present

## 2018-04-14 DIAGNOSIS — Z720 Tobacco use: Secondary | ICD-10-CM | POA: Diagnosis not present

## 2018-04-14 DIAGNOSIS — B009 Herpesviral infection, unspecified: Secondary | ICD-10-CM | POA: Diagnosis not present

## 2018-04-14 NOTE — Patient Instructions (Signed)
1.  Colposcopy with biopsies is performed today. 2.  Repeat colposcopy is recommended in 1 year 3.  Condom use is strongly encouraged to help minimize exposure to HPV virus and HSV transmission 4.  Smoking cessation is strongly encouraged   Genital Herpes Genital herpes is a common sexually transmitted infection (STI) that is caused by a virus. The virus spreads from person to person through sexual contact. Infection can cause itching, blisters, and sores around the genitals or rectum. Symptoms may last several days and then go away This is called an outbreak. However, the virus remains in your body, so you may have more outbreaks in the future. The time between outbreaks varies and can be months or years. Genital herpes affects men and women. It is particularly concerning for pregnant women because the virus can be passed to the baby during delivery and can cause serious problems. Genital herpes is also a concern for people who have a weak disease-fighting (immune) system. What are the causes? This condition is caused by the herpes simplex virus (HSV) type 1 or type 2. The virus may spread through:  Sexual contact with an infected person, including vaginal, anal, and oral sex.  Contact with fluid from a herpes sore.  The skin. This means that you can get herpes from an infected partner even if he or she does not have a visible sore or does not know that he or she is infected.  What increases the risk? You are more likely to develop this condition if:  You have sex with many partners.  You do not use latex condoms during sex.  What are the signs or symptoms? Most people do not have symptoms (asymptomatic) or have mild symptoms that may be mistaken for other skin problems. Symptoms may include:  Small red bumps near the genitals, rectum, or mouth. These bumps turn into blisters and then turn into sores.  Flu-like symptoms, including: ? Fever. ? Body aches. ? Swollen lymph  nodes. ? Headache.  Painful urination.  Pain and itching in the genital area or rectal area.  Vaginal discharge.  Tingling or shooting pain in the legs and buttocks.  Generally, symptoms are more severe and last longer during the first (primary) outbreak. Flu-like symptoms are also more common during the primary outbreak. How is this diagnosed? Genital herpes may be diagnosed based on:  A physical exam.  Your medical history.  Blood tests.  A test of a fluid sample (culture) from an open sore.  How is this treated? There is no cure for this condition, but treatment with antiviral medicines that are taken by mouth (orally) can do the following:  Speed up healing and relieve symptoms.  Help to reduce the spread of the virus to sexual partners.  Limit the chance of future outbreaks, or make future outbreaks shorter.  Lessen symptoms of future outbreaks.  Your health care provider may also recommend pain relief medicines, such as aspirin or ibuprofen. Follow these instructions at home: Sexual activity  Do not have sexual contact during active outbreaks.  Practice safe sex. Latex condoms and female condoms may help prevent the spread of the herpes virus. General instructions  Keep the affected areas dry and clean.  Take over-the-counter and prescription medicines only as told by your health care provider.  Avoid rubbing or touching blisters and sores. If you do touch blisters or sores: ? Wash your hands thoroughly with soap and water. ? Do not touch your eyes afterward.  To help relieve  pain or itching, you may take the following actions as directed by your health care provider: ? Apply a cold, wet cloth (cold compress) to affected areas 4-6 times a day. ? Apply a substance that protects your skin and reduces bleeding (astringent). ? Apply a gel that helps relieve pain around sores (lidocaine gel). ? Take a warm, shallow bath that cleans the genital area (sitz  bath).  Keep all follow-up visits as told by your health care provider. This is important. How is this prevented?  Use condoms. Although anyone can get genital herpes during sexual contact, even with the use of a condom, a condom can provide some protection.  Avoid having multiple sexual partners.  Talk with your sexual partner about any symptoms either of you may have. Also, talk with your partner about any history of STIs.  Get tested for STIs before you have sex. Ask your partner to do the same.  Do not have sexual contact if you have symptoms of genital herpes. Contact a health care provider if:  Your symptoms are not improving with medicine.  Your symptoms return.  You have new symptoms.  You have a fever.  You have abdominal pain.  You have redness, swelling, or pain in your eye.  You notice new sores on other parts of your body.  You are a woman and experience bleeding between menstrual periods.  You have had herpes and you become pregnant or plan to become pregnant. Summary  Genital herpes is a common sexually transmitted infection (STI) that is caused by the herpes simplex virus (HSV) type 1 or type 2.  These viruses are most often spread through sexual contact with an infected person.  You are more likely to develop this condition if you have sex with many partners or you have unprotected sex.  Most people do not have symptoms (asymptomatic) or have mild symptoms that may be mistaken for other skin problems. Symptoms occur as outbreaks that may happen months or years apart.  There is no cure for this condition, but treatment with oral antiviral medicines can reduce symptoms, reduce the chance of spreading the virus to a partner, prevent future outbreaks, or shorten future outbreaks. This information is not intended to replace advice given to you by your health care provider. Make sure you discuss any questions you have with your health care provider. Document  Released: 09/04/2000 Document Revised: 08/07/2016 Document Reviewed: 08/07/2016 Elsevier Interactive Patient Education  2018 [employer]  Human Papillomavirus Human papillomavirus (HPV) is the most common sexually transmitted infection (STI). It is easy to pass it from person to person (contagious). HPV can cause cervical cancer, anal cancer, and genital warts. The genital warts can be seen and felt. Also, there may be wartlike regions in the throat. HPV may not have any symptoms. It is possible to have HPV for a long time and not know it. You may pass HPV on to others without knowing it. Follow these instructions at home:  Take medicines as told by your doctor.  Use over-the-counter creams for itching as told by your doctor.  Keep all follow-up visits. Make sure to get Pap tests as told by your doctor.  Do not touch or scratch the warts.  Do not treat genital warts with medicines used for treating hand warts.  Do not have sex while you are getting treatment.  Do not douche or use tampons during treatment of HPV.  Tell your sex partner about your infection because he or she may  also need treatment.  If you get pregnant, tell your doctor that you had HPV. Your doctor will watch your pregnancy closely. This is important to keep your baby safe.  After treatment, use condoms during sex to prevent future infections.  Have only one sex partner.  Have a sex partner who does not have other sex partners. Contact a doctor if:  The treated skin is red, swollen, or painful.  You have a fever.  You feel ill.  You feel lumps or pimple-like areas in and around your genital area.  You have bleeding of the vagina or the area that was treated.  You have pain during sex. This information is not intended to replace advice given to you by your health care provider. Make sure you discuss any questions you have with your health care provider. Document Released: 08/20/2008 Document Revised:  02/13/2016 Document Reviewed: 12/13/2013 Elsevier Interactive Patient Education  2017 [employer]  Cervical Biopsy, Care After Refer to this sheet in the next few weeks. These instructions provide you with information about caring for yourself after your procedure. Your health care provider may also give you more specific instructions. Your treatment has been planned according to current medical practices, but problems sometimes occur. Call your health care provider if you have any problems or questions after your procedure. What can I expect after the procedure? After the procedure, it is common to have:  Cramping or mild pain for a few days.  Slight bleeding from the vagina for a few days.  Dark-colored vaginal discharge for a few days.  Follow these instructions at home:  Take over-the-counter and prescription medicines only as told by your health care provider.  Return to your normal activities as told by your health care provider. Ask your health care provider what activities are safe for you.  Use a sanitary napkin until bleeding and discharge stop.  Do not use tampons until your health care provider approves.  Do not douche until your health care provider approves.  Do not have sex until your health care provider approves.  Keep all follow-up visits as told by your health care provider. This is important. Contact a health care provider if:  You have a fever or chills.  You have bad-smelling vaginal discharge.  You have itching or irritation around the vagina.  You have lower abdominal pain. Get help right away if:  You develop heavy vaginal bleeding that soaks more than one sanitary pad an hour.  You faint.  You have very bad lower abdominal pain. This information is not intended to replace advice given to you by your health care provider. Make sure you discuss any questions you have with your health care provider. Document Released: 05/29/2015 Document  Revised: 02/13/2016 Document Reviewed: 01/23/2015 Elsevier Interactive Patient Education  2018 [employer].

## 2018-04-14 NOTE — Progress Notes (Signed)
Pt stated that she is doing well.   Chief complaint: 1.  LGSIL Pap 2.  Colposcopy 3.  Herpes genitalis  Tobacco user STD history: Chlamydia; HSV-2 History of endometriosis Substance use history: Cannabis, cocaine Partner history: Multiple, currently monogamous   OBJECTIVE: BP 130/85   Pulse (!) 105   Ht 5\' 5"  (1.651 m)   Wt 170 lb 14.4 oz (77.5 kg)   LMP 04/03/2018   BMI 28.44 kg/m   Well-appearing female no acute distress.  Alert and oriented. Abdomen: Soft, nontender Bladder: Nontender Pelvic exam: External genitalia-normal without evidence of lesions BUS-normal Vagina-normal vaginal mucosa; no warts Cervix-no gross lesions Uterus-not examined Rectovaginal-normal external exam; no lesions  PROCEDURE: Colposcopy of cervix and upper adjacent vagina with ECC and biopsies Indications: LGSIL Pap smear Findings: Adequate colposcopy; thin rim of acetowhite epithelium between 11:00 and 1:00; acetowhite epithelium at 7:00 Biopsies:  ECC  7:00 cervix  12:00 cervix Description: Verbal consent is given.  Patient is placed in dorsolithotomy position.  Graves speculum was placed in the vagina to facilitate visualization of the cervix and upper adjacent vagina.  The vagina and cervix is painted with Fox swabs soaked in acetic acid solution.  The above noted findings are made.  ECC is performed with serrated curette.  Cervical biopsy at 7:00 and cervical biopsy at 12:00 is taken with tissular forceps.  Biopsies were sent to pathology in formalin.  Monsel solution is applied to biopsy sites for hemostasis.  Blood loss is 5 cc.  Procedure is well-tolerated.  ASSESSMENT: 1.  LGSIL Pap smear 2.  Adequate colposcopy with evidence of mild dysplasia with acetowhite epithelium between 11:00 and 1:00 and at 7:00 3.  Tobacco user 4.  History of herpes genitalis based on IgG antibody testing; no known history of outbreaks  PLAN: 1.  Management of abnormal Pap smears is reviewed. 2.  HPV  pathology and management as discussed 3.  HSV pathology and management is discussed 4.  Colposcopy with ECC and cervical biopsies as noted 5.  Smoking cessation is strongly encouraged 6.  Condom use is strongly encouraged 7.  Return in 1 year for repeat colposcopy.  If high-grade dysplasia is found patient be contacted for further management planning.  A total of 15 minutes were spent face-to-face with the patient during this encounter and over half of that time dealt with counseling and coordination of care.  Herold HarmsMartin A Loralyn Rachel, MD  Note: This dictation was prepared with Dragon dictation along with smaller phrase technology. Any transcriptional errors that result from this process are unintentional.

## 2018-05-30 ENCOUNTER — Other Ambulatory Visit: Payer: Self-pay

## 2018-05-30 MED ORDER — PANTOPRAZOLE SODIUM 40 MG PO TBEC: 40.0000 mg | DELAYED_RELEASE_TABLET | Freq: Every day | ORAL | 6 refills | Status: AC

## 2019-03-29 ENCOUNTER — Other Ambulatory Visit: Payer: Self-pay | Admitting: Surgical

## 2019-03-29 MED ORDER — LEVONORGEST-ETH ESTRAD 91-DAY 0.15-0.03 MG PO TABS: 1.0000 | ORAL_TABLET | Freq: Every day | ORAL | 0 refills | Status: DC

## 2019-04-12 DIAGNOSIS — F17210 Nicotine dependence, cigarettes, uncomplicated: Secondary | ICD-10-CM | POA: Diagnosis not present

## 2019-04-12 DIAGNOSIS — B0089 Other herpesviral infection: Secondary | ICD-10-CM | POA: Diagnosis not present

## 2019-04-12 DIAGNOSIS — F411 Generalized anxiety disorder: Secondary | ICD-10-CM | POA: Diagnosis not present

## 2019-04-12 DIAGNOSIS — F331 Major depressive disorder, recurrent, moderate: Secondary | ICD-10-CM | POA: Diagnosis not present

## 2019-04-18 ENCOUNTER — Encounter: Payer: 59 | Admitting: Obstetrics and Gynecology

## 2019-04-18 ENCOUNTER — Other Ambulatory Visit (HOSPITAL_COMMUNITY)
Admission: RE | Admit: 2019-04-18 | Discharge: 2019-04-18 | Disposition: A | Discharge status: Home / Self Care | Payer: 59 | Source: Ambulatory Visit | Attending: Obstetrics and Gynecology | Admitting: Obstetrics and Gynecology

## 2019-04-18 ENCOUNTER — Other Ambulatory Visit: Payer: Self-pay

## 2019-04-18 ENCOUNTER — Ambulatory Visit (INDEPENDENT_AMBULATORY_CARE_PROVIDER_SITE_OTHER): Payer: BC Managed Care – PPO | Admitting: Obstetrics and Gynecology

## 2019-04-18 ENCOUNTER — Encounter: Payer: Self-pay | Admitting: Obstetrics and Gynecology

## 2019-04-18 VITALS — BP 118/74 | HR 76 | Ht 65.0 in | Wt 200.4 lb

## 2019-04-18 DIAGNOSIS — N870 Mild cervical dysplasia: Secondary | ICD-10-CM

## 2019-04-18 NOTE — Progress Notes (Signed)
   HPI:  Heather Duncan is a 23 y.o.  G0P0000  who presents today for evaluation and management of abnormal cervical cytology. Patient continues to smoke cigarettes.  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  OB History  Gravida Para Term Preterm AB Living  0 0 0 0 0 0  SAB TAB Ectopic Multiple Live Births  0 0 0 0      Past Medical History:  Diagnosis Date  . Anxiety   . Chronic kidney disease   . Depression   . GERD (gastroesophageal reflux disease)     Past Surgical History:  Procedure Laterality Date  . LAPAROSCOPY N/A 05/06/2015   Procedure: LAPAROSCOPY DIAGNOSTIC AND BIOPSIES;  Surgeon: Brayton Mars, MD;  Location: [facility] ORS;  Service: Gynecology;  Laterality: N/A;  . WISDOM TOOTH EXTRACTION  2012    SOCIAL HISTORY: Social History   Substance and Sexual Activity  Alcohol Use Yes   Comment: occasionally   Social History   Substance and Sexual Activity  Drug Use Not Currently  . Frequency: 7.0 times per week  . Types: Marijuana   Comment: OCCAS- used 2 weeks ago     Family History  Problem Relation Age of Onset  . Hypertension Father   . Breast cancer Neg Hx   . Ovarian cancer Neg Hx   . Diabetes Neg Hx   . Heart disease Neg Hx     ALLERGIES:  Patient has no known allergies.  She has a current medication list which includes the following prescription(s): buspirone, lamotrigine, levonorgestrel-ethinyl estradiol, lorazepam, and pantoprazole.   PROCEDURE: 1.  Urine Pregnancy Test:   2.  Colposcopy performed with 4% acetic acid after verbal consent obtained                           -Aceto-white Lesions Location(s): 5 o'clock.              -Biopsy performed at 5 o'clock               -ECC indicated and performed: No.     -Biopsy sites made hemostatic with pressure and Monsel's solution   -Satisfactory colposcopy: Yes.      -Evidence of Invasive cervical CA  :  NO  ASSESSMENT:  Heather Duncan is a 23 y.o. G0P0000 here for  1. CIN I (cervical intraepithelial neoplasia I)   .  PLAN: 1.  I discussed the grading system of pap smears and HPV high risk viral types.  We will discuss management after colpo results return.  No orders of the defined types were placed in this encounter.          F/U  Return in about 2 weeks (around 05/02/2019).  Jeannie Fend ,MD 04/18/2019,2:00 PM

## 2019-04-18 NOTE — Progress Notes (Signed)
Patient comes in today for a colposcopy. No concerns today.

## 2019-05-02 ENCOUNTER — Encounter: Payer: Self-pay | Admitting: Obstetrics and Gynecology

## 2019-05-02 ENCOUNTER — Ambulatory Visit: Payer: BC Managed Care – PPO | Admitting: Obstetrics and Gynecology

## 2019-05-02 ENCOUNTER — Other Ambulatory Visit: Payer: Self-pay

## 2019-05-02 VITALS — BP 128/80 | HR 100 | Wt 200.0 lb

## 2019-05-02 DIAGNOSIS — N870 Mild cervical dysplasia: Secondary | ICD-10-CM | POA: Diagnosis not present

## 2019-05-02 NOTE — Progress Notes (Signed)
HPI:      [name] is a 23 y.o. G0P0000 who LMP was No LMP recorded. (Menstrual status: Oral contraceptives).  Subjective:   She presents today for follow-up of her colposcopy.  She had CIN-1 1 year ago and this was a repeat colposcopy.  It again shows CIN-1. Patient takes [facility] for birth control.  She is due for an annual exam Patient smokes cigarettes-relationship between HPV and tobacco use discussed in detail at previous visit.        Review of Systems:  Review of Systems  Constitutional: Denied constitutional symptoms, night sweats, recent illness, fatigue, fever, insomnia and weight loss.  Eyes: Denied eye symptoms, eye pain, photophobia, vision change and visual disturbance.  Ears/Nose/Throat/Neck: Denied ear, nose, throat or neck symptoms, hearing loss, nasal discharge, sinus congestion and sore throat.  Cardiovascular: Denied cardiovascular symptoms, arrhythmia, chest pain/pressure, edema, exercise intolerance, orthopnea and palpitations.  Respiratory: Denied pulmonary symptoms, asthma, pleuritic pain, productive sputum, cough, dyspnea and wheezing.  Gastrointestinal: Denied, gastro-esophageal reflux, melena, nausea and vomiting.  Genitourinary: Denied genitourinary symptoms including symptomatic vaginal discharge, pelvic relaxation issues, and urinary complaints.  Musculoskeletal: Denied musculoskeletal symptoms, stiffness, swelling, muscle weakness and myalgia.  Dermatologic: Denied dermatology symptoms, rash and scar.  Neurologic: Denied neurology symptoms, dizziness, headache, neck pain and syncope.  Psychiatric: Denied psychiatric symptoms, anxiety and depression.  Endocrine: Denied endocrine symptoms including hot flashes and night sweats.   Meds:   Current Outpatient Medications on File Prior to Visit  Medication Sig Dispense Refill  . busPIRone (BUSPAR) 7.5 MG tablet Take 7.5 mg by mouth 3 (three) times daily.    Marland Kitchen. lamoTRIgine (LAMICTAL) 25 MG tablet TK 1  T PO QD FOR 2 WEEKS AND THEN 2 FOR 2 WEEKS AND 4 TS QD STOP IF RASH OCCURS  2  . levonorgestrel-ethinyl estradiol (INTROVALE) 0.15-0.03 MG tablet Take 1 tablet by mouth daily. 91 tablet 0  . LORazepam (ATIVAN) 0.5 MG tablet Take 1 tablet (0.5 mg total) by mouth daily. (Patient not taking: Reported on 04/18/2019) 12 tablet 0  . pantoprazole (PROTONIX) 40 MG tablet Take 1 tablet (40 mg total) by mouth daily. (Patient not taking: Reported on 04/18/2019) 30 tablet 6   No current facility-administered medications on file prior to visit.     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 LGSIL on Pap smear of cervix 04/14/2018  . PCR DNA positive for HSV2 03/31/2018  . Abnormal uterine bleeding (AUB) 03/30/2018  . Chronic nausea 06/15/2017  . Positive Chlamyida test 06/15/2017  . Endometriosis 05/16/2015  . Tobacco user 03/14/2015  . Gastroenteritis, acute 03/03/2015  . AA (alcohol abuse) 04/03/2014  . Benzodiazepine dependence (HCC) 04/03/2014  . Cluster B personality disorder (HCC) 04/03/2014  . Cocaine abuse (HCC) 04/03/2014  . Clinical depression 04/03/2014  . Cannabis abuse 04/03/2014  . Self-destructive behavior 04/03/2014  . Suicide attempt by acetaminophen overdose (HCC) 04/03/2014  . Benzodiazepine abuse (HCC) 04/03/2014     1. CIN I (cervical intraepithelial neoplasia I)        Plan:            1.  I discussed her biopsy results with her.  Relationship to HPV and cervical dysplasia and cervical cancer discussed in  detail.  Tobacco use and HPV discussed. We have decided upon annual Pap smears with well woman check realizing that her Pap smears may be low-grade but that colposcopy is not immediately necessary. Orders No orders of the defined types were placed in this encounter.   No orders of the  defined types were placed in this encounter.     F/U  Return for Annual Physical. I spent 16 minutes involved in the care of this patient of which greater than 50% was spent discussing colposcopy results, future follow-ups, HPV and cervical dysplasia.  Finis Bud, M.D. 05/02/2019 2:24 PM

## 2019-05-02 NOTE — Progress Notes (Signed)
Patient comes in today for colposcopy results.  

## 2019-05-03 DIAGNOSIS — R300 Dysuria: Secondary | ICD-10-CM | POA: Diagnosis not present

## 2019-05-03 DIAGNOSIS — N3000 Acute cystitis without hematuria: Secondary | ICD-10-CM | POA: Diagnosis not present

## 2019-07-05 DIAGNOSIS — F411 Generalized anxiety disorder: Secondary | ICD-10-CM | POA: Diagnosis not present

## 2019-07-05 DIAGNOSIS — F17210 Nicotine dependence, cigarettes, uncomplicated: Secondary | ICD-10-CM | POA: Diagnosis not present

## 2019-07-05 DIAGNOSIS — B0089 Other herpesviral infection: Secondary | ICD-10-CM | POA: Diagnosis not present

## 2019-07-05 DIAGNOSIS — F331 Major depressive disorder, recurrent, moderate: Secondary | ICD-10-CM | POA: Diagnosis not present

## 2019-08-01 ENCOUNTER — Other Ambulatory Visit: Payer: Self-pay | Admitting: Obstetrics and Gynecology

## 2019-08-31 ENCOUNTER — Other Ambulatory Visit: Payer: Self-pay | Admitting: Obstetrics and Gynecology

## 2019-09-09 DIAGNOSIS — Z20828 Contact with and (suspected) exposure to other viral communicable diseases: Secondary | ICD-10-CM | POA: Diagnosis not present

## 2019-09-26 DIAGNOSIS — Z20828 Contact with and (suspected) exposure to other viral communicable diseases: Secondary | ICD-10-CM | POA: Diagnosis not present

## 2019-09-26 DIAGNOSIS — Z03818 Encounter for observation for suspected exposure to other biological agents ruled out: Secondary | ICD-10-CM | POA: Diagnosis not present

## 2019-09-26 DIAGNOSIS — U071 COVID-19: Secondary | ICD-10-CM | POA: Diagnosis not present

## 2019-10-03 DIAGNOSIS — B0089 Other herpesviral infection: Secondary | ICD-10-CM | POA: Diagnosis not present

## 2019-10-03 DIAGNOSIS — F411 Generalized anxiety disorder: Secondary | ICD-10-CM | POA: Diagnosis not present

## 2019-10-03 DIAGNOSIS — F17210 Nicotine dependence, cigarettes, uncomplicated: Secondary | ICD-10-CM | POA: Diagnosis not present

## 2019-10-03 DIAGNOSIS — F331 Major depressive disorder, recurrent, moderate: Secondary | ICD-10-CM | POA: Diagnosis not present

## 2019-11-10 ENCOUNTER — Other Ambulatory Visit: Payer: Self-pay | Admitting: Obstetrics and Gynecology

## 2019-11-17 ENCOUNTER — Telehealth: Payer: Self-pay | Admitting: Obstetrics and Gynecology

## 2019-11-17 MED ORDER — LEVONORG-ETH ESTRAD TRIPHASIC 50-30/75-40/ 125-30 MCG PO TABS: 1.0000 | ORAL_TABLET | Freq: Every day | ORAL | 3 refills | Status: AC

## 2019-11-17 NOTE — Telephone Encounter (Signed)
Patient called saying she needed her birth control refilled.  -TC

## 2019-11-17 NOTE — Telephone Encounter (Signed)
[facility] prescription sent to pharmacy. LM for patient that RX was sent to pharmacy.

## 2019-11-20 DIAGNOSIS — R300 Dysuria: Secondary | ICD-10-CM | POA: Diagnosis not present

## 2019-11-20 DIAGNOSIS — N3001 Acute cystitis with hematuria: Secondary | ICD-10-CM | POA: Diagnosis not present

## 2020-02-21 DIAGNOSIS — F1010 Alcohol abuse, uncomplicated: Secondary | ICD-10-CM | POA: Diagnosis not present

## 2020-02-21 DIAGNOSIS — Z1389 Encounter for screening for other disorder: Secondary | ICD-10-CM | POA: Diagnosis not present

## 2020-02-21 DIAGNOSIS — F331 Major depressive disorder, recurrent, moderate: Secondary | ICD-10-CM | POA: Diagnosis not present

## 2020-02-21 DIAGNOSIS — F411 Generalized anxiety disorder: Secondary | ICD-10-CM | POA: Diagnosis not present

## 2020-03-20 DIAGNOSIS — Z0000 Encounter for general adult medical examination without abnormal findings: Secondary | ICD-10-CM | POA: Diagnosis not present

## 2020-03-20 DIAGNOSIS — K219 Gastro-esophageal reflux disease without esophagitis: Secondary | ICD-10-CM | POA: Diagnosis not present

## 2020-03-20 DIAGNOSIS — Z23 Encounter for immunization: Secondary | ICD-10-CM | POA: Diagnosis not present

## 2020-03-20 DIAGNOSIS — R945 Abnormal results of liver function studies: Secondary | ICD-10-CM | POA: Diagnosis not present

## 2020-03-20 DIAGNOSIS — Z130 Encounter for screening for diseases of the blood and blood-forming organs and certain disorders involving the immune mechanism: Secondary | ICD-10-CM | POA: Diagnosis not present

## 2020-03-20 DIAGNOSIS — Z0001 Encounter for general adult medical examination with abnormal findings: Secondary | ICD-10-CM | POA: Diagnosis not present

## 2020-03-20 DIAGNOSIS — R5383 Other fatigue: Secondary | ICD-10-CM | POA: Diagnosis not present

## 2020-03-20 DIAGNOSIS — Z8349 Family history of other endocrine, nutritional and metabolic diseases: Secondary | ICD-10-CM | POA: Diagnosis not present

## 2020-03-20 DIAGNOSIS — Z114 Encounter for screening for human immunodeficiency virus [HIV]: Secondary | ICD-10-CM | POA: Diagnosis not present

## 2020-03-20 DIAGNOSIS — Z13220 Encounter for screening for lipoid disorders: Secondary | ICD-10-CM | POA: Diagnosis not present

## 2020-04-17 DIAGNOSIS — Z23 Encounter for immunization: Secondary | ICD-10-CM | POA: Diagnosis not present

## 2020-05-02 ENCOUNTER — Encounter: Payer: BC Managed Care – PPO | Admitting: Obstetrics and Gynecology

## 2020-05-07 ENCOUNTER — Encounter: Payer: BC Managed Care – PPO | Admitting: Obstetrics and Gynecology

## 2020-05-17 DIAGNOSIS — F411 Generalized anxiety disorder: Secondary | ICD-10-CM | POA: Diagnosis not present

## 2020-05-17 DIAGNOSIS — F331 Major depressive disorder, recurrent, moderate: Secondary | ICD-10-CM | POA: Diagnosis not present

## 2020-05-17 DIAGNOSIS — Z1389 Encounter for screening for other disorder: Secondary | ICD-10-CM | POA: Diagnosis not present

## 2020-05-30 ENCOUNTER — Encounter: Payer: Self-pay | Admitting: Obstetrics and Gynecology

## 2020-07-23 DIAGNOSIS — Z124 Encounter for screening for malignant neoplasm of cervix: Secondary | ICD-10-CM | POA: Diagnosis not present

## 2020-07-23 DIAGNOSIS — Z309 Encounter for contraceptive management, unspecified: Secondary | ICD-10-CM | POA: Diagnosis not present

## 2020-07-23 DIAGNOSIS — Z01419 Encounter for gynecological examination (general) (routine) without abnormal findings: Secondary | ICD-10-CM | POA: Diagnosis not present

## 2020-07-23 DIAGNOSIS — Z113 Encounter for screening for infections with a predominantly sexual mode of transmission: Secondary | ICD-10-CM | POA: Diagnosis not present

## 2020-08-09 DIAGNOSIS — F331 Major depressive disorder, recurrent, moderate: Secondary | ICD-10-CM | POA: Diagnosis not present

## 2020-08-09 DIAGNOSIS — Z1389 Encounter for screening for other disorder: Secondary | ICD-10-CM | POA: Diagnosis not present

## 2020-08-09 DIAGNOSIS — F411 Generalized anxiety disorder: Secondary | ICD-10-CM | POA: Diagnosis not present
